# Patient Record
Sex: Female | Born: 2000 | Race: White | Hispanic: No | Marital: Single | Smoking: Never smoker
Health system: Southern US, Community
[De-identification: ages and names within clinical notes are randomized; demographics above are authoritative.]

## PROBLEM LIST (undated history)

## (undated) DIAGNOSIS — F418 Other specified anxiety disorders: Secondary | ICD-10-CM

## (undated) DIAGNOSIS — G43909 Migraine, unspecified, not intractable, without status migrainosus: Secondary | ICD-10-CM

## (undated) HISTORY — PX: WISDOM TOOTH EXTRACTION: SHX21

## (undated) HISTORY — DX: Migraine, unspecified, not intractable, without status migrainosus: G43.909

---
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
## 2019-10-16 ENCOUNTER — Other Ambulatory Visit: Payer: Self-pay

## 2019-10-16 ENCOUNTER — Ambulatory Visit: Payer: BC Managed Care – PPO | Admitting: Certified Nurse Midwife

## 2019-10-16 ENCOUNTER — Encounter: Payer: Self-pay | Admitting: Certified Nurse Midwife

## 2019-10-16 VITALS — BP 104/63 | HR 95 | Ht 63.0 in | Wt 175.0 lb

## 2019-10-16 DIAGNOSIS — N760 Acute vaginitis: Secondary | ICD-10-CM

## 2019-10-16 DIAGNOSIS — N921 Excessive and frequent menstruation with irregular cycle: Secondary | ICD-10-CM

## 2019-10-16 MED ORDER — MISOPROSTOL 200 MCG PO TABS: ORAL_TABLET | ORAL | 0 refills | Status: DC

## 2019-10-16 NOTE — Progress Notes (Signed)
Patient c/o thick yellow vaginal discharge x7 months.  Patient currently on weekly Diflucan for the past month with no relief.

## 2019-10-16 NOTE — Progress Notes (Signed)
GYN ENCOUNTER NOTE  Subjective:       Kelly Burnett is a 18 y.o. G0P0000 female is here for gynecologic evaluation of the following issues:  1. Recurrent yeast infection 2. Breakthrough bleeding on OCPs  Reports thick, yellow vaginal discharge with internal and external itching for the last seven (7) months. Diagnosed as yeast by PCP on three (3) separate occasions.   Completed first month of three (3) month weekly course of diflucan with limited relief.   Changed OCP recently due to continued breakthrough bleeding. Taking Lamictal. Questions IUD insertion.   Denies difficulty breathing or respiratory distress, chest pain, abdominal pain, excessive vaginal bleeding, dysuria, and leg pain or swelling.    Gynecologic History  Patient's last menstrual period was 09/27/2019 (exact date). Period Cycle (Days): 30 Period Duration (Days): 14 Period Pattern: Regular Menstrual Flow: Heavy, Light Menstrual Control: Tampon, Maxi pad, Thin pad Dysmenorrhea: (!) Severe Dysmenorrhea Symptoms: Cramping  Contraception: abstinence; never sexually active  Last Pap: N/A  Obstetric History  OB History  Gravida Para Term Preterm AB Living  0 0 0 0 0 0  SAB TAB Ectopic Multiple Live Births  0 0 0 0 0    Past Medical History:  Diagnosis Date  . Anxiety associated with depression     Past Surgical History:  Procedure Laterality Date  . WISDOM TOOTH EXTRACTION      Current Outpatient Medications on File Prior to Visit  Medication Sig Dispense Refill  . busPIRone (BUSPAR) 15 MG tablet Take 15 mg by mouth daily.     Marland Kitchen desvenlafaxine (PRISTIQ) 50 MG 24 hr tablet Take 50 mg by mouth daily.    . fluconazole (DIFLUCAN) 150 MG tablet Take 150 mg by mouth once a week.    Marland Kitchen FLUoxetine (PROZAC) 10 MG capsule Take 20 mg by mouth daily.     . hydrOXYzine (ATARAX/VISTARIL) 10 MG tablet Take 10 mg by mouth 2 (two) times daily as needed.    . lamoTRIgine (LAMICTAL) 25 MG tablet Take 25 mg by mouth  daily.     Marland Kitchen levonorgestrel-ethinyl estradiol (AVIANE,ALESSE,LESSINA) 0.1-20 MG-MCG tablet Take 1 tablet by mouth daily.     . mometasone (ELOCON) 0.1 % ointment Apply 1 application topically as needed.    Marland Kitchen omeprazole (PRILOSEC) 40 MG capsule Take 40 mg by mouth daily.    . traZODone (DESYREL) 50 MG tablet Take 1 tablet by mouth as needed.     No current facility-administered medications on file prior to visit.     No Known Allergies  Social History   Socioeconomic History  . Marital status: Single    Spouse name: Not on file  . Number of children: Not on file  . Years of education: Not on file  . Highest education level: Not on file  Occupational History  . Not on file  Social Needs  . Financial resource strain: Not on file  . Food insecurity    Worry: Not on file    Inability: Not on file  . Transportation needs    Medical: Not on file    Non-medical: Not on file  Tobacco Use  . Smoking status: Never Smoker  . Smokeless tobacco: Never Used  Substance and Sexual Activity  . Alcohol use: Never    Frequency: Never  . Drug use: Never  . Sexual activity: Never    Birth control/protection: Pill  Lifestyle  . Physical activity    Days per week: Not on file    Minutes  per session: Not on file  . Stress: Not on file  Relationships  . Social Herbalist on phone: Not on file    Gets together: Not on file    Attends religious service: Not on file    Active member of club or organization: Not on file    Attends meetings of clubs or organizations: Not on file    Relationship status: Not on file  . Intimate partner violence    Fear of current or ex partner: Not on file    Emotionally abused: Not on file    Physically abused: Not on file    Forced sexual activity: Not on file  Other Topics Concern  . Not on file  Social History Narrative  . Not on file    Family History  Problem Relation Age of Onset  . Healthy Mother   . Healthy Father   . Breast cancer  Neg Hx   . Ovarian cancer Neg Hx   . Colon cancer Neg Hx     The following portions of the patient's history were reviewed and updated as appropriate: allergies, current medications, past family history, past medical history, past social history, past surgical history and problem list.  Review of Systems  ROS negative except as noted above. Information obtained from patient.   Objective:   BP 104/63   Pulse 95   Ht 5\' 3"  (1.6 m)   Wt 175 lb (79.4 kg)   LMP 09/27/2019 (Exact Date)   BMI 31.00 kg/m    CONSTITUTIONAL: Well-developed, well-nourished female in no acute distress.   PELVIC:  External Genitalia: Normal except thick, white discharge present between labia  Vagina: Swab collected  MUSCULOSKELETAL: Normal range of motion. No tenderness.  No cyanosis, clubbing, or edema.  Assessment:   1. Recurrent vaginitis  - Candida 6 Species Profile, NAA  2. Breakthrough bleeding on OCPs  Plan:   Vaginal swab collected, see orders.   Discussed vaginal health techniques and home treatment measures. Sample of Hylafem given.   Education regarding IUDs and management of bleeding. Pamphlets given for [place].   Reviewed red flag symptoms and when to call.   Rx: Cytotec, see orders  RTC for IUD insertion as desired.    Diona Fanti, CNM [facility], [facility]

## 2019-10-16 NOTE — Patient Instructions (Addendum)
Boric Acid vaginal suppository What is this medicine? BORIC ACID (BOHR ik AS id) helps to promote the proper acid balance in the vagina. It is used to help treat yeast infections of the vagina and relieve symptoms such as itching and burning. This medicine may be used for other purposes; ask your health care provider or pharmacist if you have questions. COMMON BRAND NAME(S): Hylafem What should I tell my health care provider before I take this medicine? They need to know if you have any of these conditions:  diabetes  frequent infections  HIV or AIDS  immune system problems  an unusual or allergic reaction to boric acid, other medicines, foods, dyes, or preservatives  pregnant or trying to get pregnant  breast-feeding How should I use this medicine? This medicine is for use in the vagina. Do not take by mouth. Follow the directions on the prescription label. Read package directions carefully before using. Wash hands before and after use. Use this medicine at bedtime, unless otherwise directed by your doctor. Do not use your medicine more often than directed. Do not stop using this medicine except on your doctor's advice. Talk to your pediatrician regarding the use of this medicine in children. This medicine is not approved for use in children. Overdosage: If you think you have taken too much of this medicine contact a poison control center or emergency room at once. NOTE: This medicine is only for you. Do not share this medicine with others. What if I miss a dose? If you miss a dose, use it as soon as you can. If it is almost time for your next dose, use only that dose. Do not use double or extra doses. What may interact with this medicine? Interactions are not expected. Do not use any other vaginal products without telling your doctor or health care professional. This list may not describe all possible interactions. Give your health care provider a list of all the medicines, herbs,  non-prescription drugs, or dietary supplements you use. Also tell them if you smoke, drink alcohol, or use illegal drugs. Some items may interact with your medicine. What should I watch for while using this medicine? Tell your doctor or health care professional if your symptoms do not start to get better within a few days. It is better not to have sex until you have finished your treatment. This medicine may damage condoms or diaphragms and cause them not to work properly. It may also decrease the effect of vaginal spermicides. Do not rely on any of these methods to prevent sexually transmitted diseases or pregnancy while you are using this medicine. Vaginal medicines usually will come out of the vagina during treatment. To keep the medicine from getting on your clothing, wear a panty liner. The use of tampons is not recommended. To help clear up the infection, wear freshly washed cotton, not synthetic, underwear. What side effects may I notice from receiving this medicine? Side effects that you should report to your doctor or health care professional as soon as possible:  allergic reactions like skin rash, itching or hives  vaginal irritation, redness, or burning Side effects that usually do not require medical attention (report to your doctor or health care professional if they continue or are bothersome):  vaginal discharge This list may not describe all possible side effects. Call your doctor for medical advice about side effects. You may report side effects to FDA at 1-800-FDA-1088. Where should I keep my medicine? Keep out of the reach of  children. Store in a cool, dry place between 15 and 30 degrees C (59 and 86 degrees F). Keep away from sunlight. Throw away any unused medicine after the expiration date. NOTE: This sheet is a summary. It may not cover all possible information. If you have questions about this medicine, talk to your doctor, pharmacist, or health care provider.  2020  Elsevier/Gold Standard (2016-01-09 07:29:58) Vaginitis Vaginitis is a condition in which the vaginal tissue swells and becomes red (inflamed). This condition is most often caused by a change in the normal balance of bacteria and yeast that live in the vagina. This change causes an overgrowth of certain bacteria or yeast, which causes the inflammation. There are different types of vaginitis, but the most common types are:  Bacterial vaginosis.  Yeast infection (candidiasis).  Trichomoniasis vaginitis. This is a sexually transmitted disease (STD).  Viral vaginitis.  Atrophic vaginitis.  Allergic vaginitis. What are the causes? The cause of this condition depends on the type of vaginitis. It can be caused by:  Bacteria (bacterial vaginosis).  Yeast, which is a fungus (yeast infection).  A parasite (trichomoniasis vaginitis).  A virus (viral vaginitis).  Low hormone levels (atrophic vaginitis). Low hormone levels can occur during pregnancy, breastfeeding, or after menopause.  Irritants, such as bubble baths, scented tampons, and feminine sprays (allergic vaginitis). Other factors can change the normal balance of the yeast and bacteria that live in the vagina. These include:  Antibiotic medicines.  Poor hygiene.  Diaphragms, vaginal sponges, spermicides, birth control pills, and intrauterine devices (IUD).  Sex.  Infection.  Uncontrolled diabetes.  A weakened defense (immune) system. What increases the risk? This condition is more likely to develop in women who:  Smoke.  Use vaginal douches, scented tampons, or scented sanitary pads.  Wear tight-fitting pants.  Wear thong underwear.  Use oral birth control pills or an IUD.  Have sex without a condom.  Have multiple sex partners.  Have an STD.  Frequently use the spermicide nonoxynol-9.  Eat lots of foods high in sugar.  Have uncontrolled diabetes.  Have low estrogen levels.  Have a weakened immune  system from an immune disorder or medical treatment.  Are pregnant or breastfeeding. What are the signs or symptoms? Symptoms vary depending on the cause of the vaginitis. Common symptoms include:  Abnormal vaginal discharge. ? The discharge is white, gray, or yellow with bacterial vaginosis. ? The discharge is thick, white, and cheesy with a yeast infection. ? The discharge is frothy and yellow or greenish with trichomoniasis.  A bad vaginal smell. The smell is fishy with bacterial vaginosis.  Vaginal itching, pain, or swelling.  Sex that is painful.  Pain or burning when urinating. Sometimes there are no symptoms. How is this diagnosed? This condition is diagnosed based on your symptoms and medical history. A physical exam, including a pelvic exam, will also be done. You may also have other tests, including:  Tests to determine the pH level (acidity or alkalinity) of your vagina.  A whiff test, to assess the odor that results when a sample of your vaginal discharge is mixed with a potassium hydroxide solution.  Tests of vaginal fluid. A sample will be examined under a microscope. How is this treated? Treatment varies depending on the type of vaginitis you have. Your treatment may include:  Antibiotic creams or pills to treat bacterial vaginosis and trichomoniasis.  Antifungal medicines, such as vaginal creams or suppositories, to treat a yeast infection.  Medicine to ease discomfort if  you have viral vaginitis. Your sexual partner should also be treated.  Estrogen delivered in a cream, pill, suppository, or vaginal ring to treat atrophic vaginitis. If vaginal dryness occurs, lubricants and moisturizing creams may help. You may need to avoid scented soaps, sprays, or douches.  Stopping use of a product that is causing allergic vaginitis. Then using a vaginal cream to treat the symptoms. Follow these instructions at home: Lifestyle  Keep your genital area clean and dry.  Avoid soap, and only rinse the area with water.  Do not douche or use tampons until your health care provider says it is okay to do so. Use sanitary pads, if needed.  Do not have sex until your health care provider approves. When you can return to sex, practice safe sex and use condoms.  Wipe from front to back. This avoids the spread of bacteria from the rectum to the vagina. General instructions  Take over-the-counter and prescription medicines only as told by your health care provider.  If you were prescribed an antibiotic medicine, take or use it as told by your health care provider. Do not stop taking or using the antibiotic even if you start to feel better.  Keep all follow-up visits as told by your health care provider. This is important. How is this prevented?  Use mild, non-scented products. Do not use things that can irritate the vagina, such as fabric softeners. Avoid the following products if they are scented: ? Feminine sprays. ? Detergents. ? Tampons. ? Feminine hygiene products. ? Soaps or bubble baths.  Let air reach your genital area. ? Wear cotton underwear to reduce moisture buildup. ? Avoid wearing underwear while you sleep. ? Avoid wearing tight pants and underwear or nylons without a cotton panel. ? Avoid wearing thong underwear.  Take off any wet clothing, such as bathing suits, as soon as possible.  Practice safe sex and use condoms. Contact a health care provider if:  You have abdominal pain.  You have a fever.  You have symptoms that last for more than 2-3 days. Get help right away if:  You have a fever and your symptoms suddenly get worse. Summary  Vaginitis is a condition in which the vaginal tissue becomes inflamed.This condition is most often caused by a change in the normal balance of bacteria and yeast that live in the vagina.  Treatment varies depending on the type of vaginitis you have.  Do not douche, use tampons , or have sex until  your health care provider approves. When you can return to sex, practice safe sex and use condoms. This information is not intended to replace advice given to you by your health care provider. Make sure you discuss any questions you have with your health care provider. Document Released: 10/04/2007 Document Revised: 11/19/2017 Document Reviewed: 01/12/2017 Elsevier Patient Education  2020 [employer].  Levonorgestrel intrauterine device (IUD) What is this medicine? LEVONORGESTREL IUD (LEE voe nor jes trel) is a contraceptive (birth control) device. The device is placed inside the uterus by a healthcare professional. It is used to prevent pregnancy. This device can also be used to treat heavy bleeding that occurs during your period. This medicine may be used for other purposes; ask your health care provider or pharmacist if you have questions. COMMON BRAND NAME(S): Cameron Ali What should I tell my health care provider before I take this medicine? They need to know if you have any of these conditions:  abnormal Pap smear  cancer of  the breast, uterus, or cervix  diabetes  endometritis  genital or pelvic infection now or in the past  have more than one sexual partner or your partner has more than one partner  heart disease  history of an ectopic or tubal pregnancy  immune system problems  IUD in place  liver disease or tumor  problems with blood clots or take blood-thinners  seizures  use intravenous drugs  uterus of unusual shape  vaginal bleeding that has not been explained  an unusual or allergic reaction to levonorgestrel, other hormones, silicone, or polyethylene, medicines, foods, dyes, or preservatives  pregnant or trying to get pregnant  breast-feeding How should I use this medicine? This device is placed inside the uterus by a health care professional. Talk to your pediatrician regarding the use of this medicine in children. Special  care may be needed. Overdosage: If you think you have taken too much of this medicine contact a poison control center or emergency room at once. NOTE: This medicine is only for you. Do not share this medicine with others. What if I miss a dose? This does not apply. Depending on the brand of device you have inserted, the device will need to be replaced every 3 to 6 years if you wish to continue using this type of birth control. What may interact with this medicine? Do not take this medicine with any of the following medications:  amprenavir  bosentan  fosamprenavir This medicine may also interact with the following medications:  aprepitant  armodafinil  barbiturate medicines for inducing sleep or treating seizures  bexarotene  boceprevir  griseofulvin  medicines to treat seizures like carbamazepine, ethotoin, felbamate, oxcarbazepine, phenytoin, topiramate  modafinil  pioglitazone  rifabutin  rifampin  rifapentine  some medicines to treat HIV infection like atazanavir, efavirenz, indinavir, lopinavir, nelfinavir, tipranavir, ritonavir  St. John's wort  warfarin This list may not describe all possible interactions. Give your health care provider a list of all the medicines, herbs, non-prescription drugs, or dietary supplements you use. Also tell them if you smoke, drink alcohol, or use illegal drugs. Some items may interact with your medicine. What should I watch for while using this medicine? Visit your doctor or health care professional for regular check ups. See your doctor if you or your partner has sexual contact with others, becomes HIV positive, or gets a sexual transmitted disease. This product does not protect you against HIV infection (AIDS) or other sexually transmitted diseases. You can check the placement of the IUD yourself by reaching up to the top of your vagina with clean fingers to feel the threads. Do not pull on the threads. It is a good habit to  check placement after each menstrual period. Call your doctor right away if you feel more of the IUD than just the threads or if you cannot feel the threads at all. The IUD may come out by itself. You may become pregnant if the device comes out. If you notice that the IUD has come out use a backup birth control method like condoms and call your health care provider. Using tampons will not change the position of the IUD and are okay to use during your period. This IUD can be safely scanned with magnetic resonance imaging (MRI) only under specific conditions. Before you have an MRI, tell your healthcare provider that you have an IUD in place, and which type of IUD you have in place. What side effects may I notice from receiving this medicine? Side  effects that you should report to your doctor or health care professional as soon as possible:  allergic reactions like skin rash, itching or hives, swelling of the face, lips, or tongue  fever, flu-like symptoms  genital sores  high blood pressure  no menstrual period for 6 weeks during use  pain, swelling, warmth in the leg  pelvic pain or tenderness  severe or sudden headache  signs of pregnancy  stomach cramping  sudden shortness of breath  trouble with balance, talking, or walking  unusual vaginal bleeding, discharge  yellowing of the eyes or skin Side effects that usually do not require medical attention (report to your doctor or health care professional if they continue or are bothersome):  acne  breast pain  change in sex drive or performance  changes in weight  cramping, dizziness, or faintness while the device is being inserted  headache  irregular menstrual bleeding within first 3 to 6 months of use  nausea This list may not describe all possible side effects. Call your doctor for medical advice about side effects. You may report side effects to FDA at 1-800-FDA-1088. Where should I keep my medicine? This does  not apply. NOTE: This sheet is a summary. It may not cover all possible information. If you have questions about this medicine, talk to your doctor, pharmacist, or health care provider.  2020 Elsevier/Gold Standard (2018-10-18 13:22:01)   Intrauterine Device Insertion An intrauterine device (IUD) is a medical device that gets inserted into the uterus to prevent pregnancy. It is a small, T-shaped device that has one or two nylon strings hanging down from it. The strings hang out of the lower part of the uterus (cervix) to allow for future IUD removal. There are two types of IUDs available:  Copper IUD. This type of IUD has copper wire wrapped around it. Copper makes the uterus and fallopian tubes produce a fluid that kills sperm. A copper IUD may last up to 10 years.  Hormone IUD. This type of IUD is made of plastic and contains the hormone progestin (synthetic progesterone). The hormone thickens mucus in the cervix and prevents sperm from entering the uterus. It also thins the uterine lining to prevent implantation of a fertilized egg. The hormone can weaken or kill the sperm that get into the uterus. A hormone IUD may last 3-5 years. Tell a health care provider about:  Any allergies you have.  All medicines you are taking, including vitamins, herbs, eye drops, creams, and over-the-counter medicines.  Any problems you or family members have had with anesthetic medicines.  Any blood disorders you have.  Any surgeries you have had.  Any medical conditions you have, including any STIs (sexually transmitted infections) you may have.  Whether you are pregnant or may be pregnant. What are the risks? Generally, this is a safe procedure. However, problems may occur, including:  Infection.  Bleeding.  Allergic reactions to medicines.  Accidental puncture (perforation) of the uterus, or damage to other structures or organs.  Accidental placement of the IUD either in the muscle layer of  the uterus (myometrium) or outside the uterus.  The IUD falling out of the uterus (expulsion). This is more common among women who have recently had a child.  Pregnancy that happens in the fallopian tube (ectopic pregnancy).  Infection of the uterus and fallopian tubes (pelvic inflammatory disease). What happens before the procedure?  Schedule the IUD insertion for when you will have your menstrual period or right after, to make  sure you are not pregnant. Placement of the IUD is better tolerated shortly after a menstrual cycle.  Follow instructions from your health care provider about eating or drinking restrictions.  Ask your health care provider about changing or stopping your regular medicines. This is especially important if you are taking diabetes medicines or blood thinners.  You may get a pain reliever to take before the procedure.  You may have tests for: ? Pregnancy. A pregnancy test involves having a urine sample taken. ? STIs. Placing an IUD in someone who has an STI can make the infection worse. ? Cervical cancer. You may have a Pap test to check for this type of cancer. This means collecting cells from your cervix to be examined under a microscope.  You may have a physical exam to determine the size and position of your uterus. The procedure may vary among health care providers and hospitals. What happens during the procedure?  A tool (speculum) will be placed in your vagina and widened so that your health care provider can see your cervix.  Medicine may be applied to your cervix to help lower your risk of infection (antiseptic medicine).  You may be given an anesthetic medicine to numb each side of your cervix (intracervical block or paracervical block). This medicine is usually given by an injection into the cervix.  A tool (uterine sound) will be inserted into your uterus to determine the length of your uterus and the direction that your uterus may be tilted.  A slim  instrument or tube (IUD inserter) that holds the IUD will be inserted into your vagina, through your cervical canal, and into your uterus.  The IUD will be placed in the uterus, and the IUD inserter will be removed.  The strings that are attached to the IUD will be trimmed so that they lie just below the cervix. The procedure may vary among health care providers and hospitals. What happens after the procedure?  You may have bleeding after the procedure. This is normal. It varies from light bleeding (spotting) for a few days to menstrual-like bleeding.  You may have cramping and pain.  You may feel dizzy or light-headed.  You may have lower back pain. Summary  An intrauterine device (IUD) is a small, T-shaped device that has one or two nylon strings hanging down from it.  Two types of IUDs are available. You may have a copper IUD or a hormone IUD.  Schedule the IUD insertion for when you will have your menstrual period or right after, to make sure you are not pregnant. Placement of the IUD is better tolerated shortly after a menstrual cycle.  You may have bleeding after the procedure. This is normal. It varies from light spotting for a few days to menstrual-like bleeding. This information is not intended to replace advice given to you by your health care provider. Make sure you discuss any questions you have with your health care provider. Document Released: 08/05/2011 Document Revised: 11/19/2017 Document Reviewed: 10/28/2016 Elsevier Patient Education  2020 [employer].

## 2019-10-18 LAB — CANDIDA 6 SPECIES PROFILE, NAA
C PARAPSILOSIS/TROPICALIS: NEGATIVE
Candida albicans, NAA: NEGATIVE
Candida glabrata, NAA: NEGATIVE
Candida krusei, NAA: NEGATIVE
Candida lusitaniae, NAA: NEGATIVE

## 2019-10-19 ENCOUNTER — Encounter: Payer: Self-pay | Admitting: Certified Nurse Midwife

## 2019-10-20 ENCOUNTER — Other Ambulatory Visit: Payer: Self-pay

## 2019-10-20 MED ORDER — METRONIDAZOLE 0.75 % VA GEL: 1.0000 | Freq: Every day | VAGINAL | 0 refills | Status: AC

## 2019-10-27 ENCOUNTER — Encounter: Payer: Self-pay | Admitting: Certified Nurse Midwife

## 2019-10-27 ENCOUNTER — Ambulatory Visit (INDEPENDENT_AMBULATORY_CARE_PROVIDER_SITE_OTHER): Payer: BC Managed Care – PPO | Admitting: Certified Nurse Midwife

## 2019-10-27 ENCOUNTER — Other Ambulatory Visit: Payer: Self-pay

## 2019-10-27 VITALS — BP 99/67 | HR 77 | Ht 63.0 in | Wt 174.8 lb

## 2019-10-27 DIAGNOSIS — Z30430 Encounter for insertion of intrauterine contraceptive device: Secondary | ICD-10-CM

## 2019-10-27 NOTE — Patient Instructions (Signed)
IUD PLACEMENT POST-PROCEDURE INSTRUCTIONS  1. You may take Ibuprofen, Aleve or Tylenol for pain if needed.  Cramping should resolve within in 24 hours.  2. You may have a small amount of spotting.  You should wear a mini pad for the next few days.  3. You may have intercourse after 24 hours.  If you using this for birth control, it is effective immediately.  4. You need to call if you have any pelvic pain, fever, heavy bleeding or foul smelling vaginal discharge.  Irregular bleeding is common the first several months after having an IUD placed. You do not need to call for this reason unless you are concerned.  5. Shower or bathe as normal  You should have a follow-up appointment in 4-8 weeks for a re-check to make sure you are not having any problems.  Levonorgestrel intrauterine device (IUD) What is this medicine? LEVONORGESTREL IUD (LEE voe nor jes trel) is a contraceptive (birth control) device. The device is placed inside the uterus by a healthcare professional. It is used to prevent pregnancy. This device can also be used to treat heavy bleeding that occurs during your period. This medicine may be used for other purposes; ask your health care provider or pharmacist if you have questions. COMMON BRAND NAME(S): Kyleena, LILETTA, Mirena, Skyla What should I tell my health care provider before I take this medicine? They need to know if you have any of these conditions:  abnormal Pap smear  cancer of the breast, uterus, or cervix  diabetes  endometritis  genital or pelvic infection now or in the past  have more than one sexual partner or your partner has more than one partner  heart disease  history of an ectopic or tubal pregnancy  immune system problems  IUD in place  liver disease or tumor  problems with blood clots or take blood-thinners  seizures  use intravenous drugs  uterus of unusual shape  vaginal bleeding that has not been explained  an unusual or  allergic reaction to levonorgestrel, other hormones, silicone, or polyethylene, medicines, foods, dyes, or preservatives  pregnant or trying to get pregnant  breast-feeding How should I use this medicine? This device is placed inside the uterus by a health care professional. Talk to your pediatrician regarding the use of this medicine in children. Special care may be needed. Overdosage: If you think you have taken too much of this medicine contact a poison control center or emergency room at once. NOTE: This medicine is only for you. Do not share this medicine with others. What if I miss a dose? This does not apply. Depending on the brand of device you have inserted, the device will need to be replaced every 3 to 6 years if you wish to continue using this type of birth control. What may interact with this medicine? Do not take this medicine with any of the following medications:  amprenavir  bosentan  fosamprenavir This medicine may also interact with the following medications:  aprepitant  armodafinil  barbiturate medicines for inducing sleep or treating seizures  bexarotene  boceprevir  griseofulvin  medicines to treat seizures like carbamazepine, ethotoin, felbamate, oxcarbazepine, phenytoin, topiramate  modafinil  pioglitazone  rifabutin  rifampin  rifapentine  some medicines to treat HIV infection like atazanavir, efavirenz, indinavir, lopinavir, nelfinavir, tipranavir, ritonavir  St. John's wort  warfarin This list may not describe all possible interactions. Give your health care provider a list of all the medicines, herbs, non-prescription drugs, or dietary supplements   you use. Also tell them if you smoke, drink alcohol, or use illegal drugs. Some items may interact with your medicine. What should I watch for while using this medicine? Visit your doctor or health care professional for regular check ups. See your doctor if you or your partner has sexual  contact with others, becomes HIV positive, or gets a sexual transmitted disease. This product does not protect you against HIV infection (AIDS) or other sexually transmitted diseases. You can check the placement of the IUD yourself by reaching up to the top of your vagina with clean fingers to feel the threads. Do not pull on the threads. It is a good habit to check placement after each menstrual period. Call your doctor right away if you feel more of the IUD than just the threads or if you cannot feel the threads at all. The IUD may come out by itself. You may become pregnant if the device comes out. If you notice that the IUD has come out use a backup birth control method like condoms and call your health care provider. Using tampons will not change the position of the IUD and are okay to use during your period. This IUD can be safely scanned with magnetic resonance imaging (MRI) only under specific conditions. Before you have an MRI, tell your healthcare provider that you have an IUD in place, and which type of IUD you have in place. What side effects may I notice from receiving this medicine? Side effects that you should report to your doctor or health care professional as soon as possible:  allergic reactions like skin rash, itching or hives, swelling of the face, lips, or tongue  fever, flu-like symptoms  genital sores  high blood pressure  no menstrual period for 6 weeks during use  pain, swelling, warmth in the leg  pelvic pain or tenderness  severe or sudden headache  signs of pregnancy  stomach cramping  sudden shortness of breath  trouble with balance, talking, or walking  unusual vaginal bleeding, discharge  yellowing of the eyes or skin Side effects that usually do not require medical attention (report to your doctor or health care professional if they continue or are bothersome):  acne  breast pain  change in sex drive or performance  changes in  weight  cramping, dizziness, or faintness while the device is being inserted  headache  irregular menstrual bleeding within first 3 to 6 months of use  nausea This list may not describe all possible side effects. Call your doctor for medical advice about side effects. You may report side effects to FDA at 1-800-FDA-1088. Where should I keep my medicine? This does not apply. NOTE: This sheet is a summary. It may not cover all possible information. If you have questions about this medicine, talk to your doctor, pharmacist, or health care provider.  2020 Elsevier/Gold Standard (2018-10-18 13:22:01)  

## 2019-10-27 NOTE — Progress Notes (Signed)
Kelly Burnett is a 18 y.o. year old G80P0000 Caucasian female who presents for placement of a Mirena IUD.  BP 99/67   Pulse 77   Ht 5\' 3"  (1.6 m)   Wt 174 lb 12.8 oz (79.3 kg)   LMP 10/25/2019 (Exact Date)   BMI 30.96 kg/m   Patient has never been sexually active.   The risks and benefits of the method and placement have been thouroughly reviewed with the patient and all questions were answered.  Specifically the patient is aware of failure rate of 12/998, expulsion of the IUD and of possible perforation.  The patient is aware of irregular bleeding due to the method and understands the incidence of irregular bleeding diminishes with time.  Signed copy of informed consent in chart.   Time out was performed.  A small plastic speculum was placed in the vagina.  The cervix was visualized, prepped using Betadine, and grasped with a single tooth tenaculum. The uterus was  sounded to 9 cm.  Mirena IUD placed per manufacturer's recommendations.   The strings were trimmed to 3 cm.  The patient was given post procedure instructions, including signs and symptoms of infection and to check for the strings after each menses or each month, and refraining from intercourse or anything in the vagina for 3 days.  She was given a Mirena care card with date Mirena placed, and date Mirena to be removed.  Reviewed red flag symptoms and when to call.   RTC x 6-8 weeks for IUD string check or sooner if needed.    Diona Fanti, CNM [facility], [facility] 10/27/19 9:19 AM   NDC: 71696-789-38 Lot: [id number] Exp: 11/2021

## 2019-10-27 NOTE — Progress Notes (Signed)
Patient here for IUD insertion, no complaints.

## 2019-11-02 ENCOUNTER — Encounter: Payer: Self-pay | Admitting: Certified Nurse Midwife

## 2019-12-07 ENCOUNTER — Other Ambulatory Visit: Payer: Self-pay

## 2019-12-07 ENCOUNTER — Encounter: Payer: Self-pay | Admitting: Obstetrics and Gynecology

## 2019-12-07 ENCOUNTER — Ambulatory Visit: Payer: BC Managed Care – PPO | Admitting: Obstetrics and Gynecology

## 2019-12-07 VITALS — BP 115/70 | HR 83 | Ht 63.0 in | Wt 182.8 lb

## 2019-12-07 DIAGNOSIS — Z975 Presence of (intrauterine) contraceptive device: Secondary | ICD-10-CM

## 2019-12-07 NOTE — Progress Notes (Signed)
    GYNECOLOGY PROGRESS NOTE  Subjective:    Patient ID: Kelly Burnett, female    DOB: March 10, 2001, 18 y.o.   MRN: [id number]  HPI  Patient is a 18 y.o. G0P0000 female who presents for complaints of left sided sharp pain x 2 days, intermittent that occurred several weeks ago. She is a patient of Dani Gobble, CNM (out on maternity leave). Notes that she has not had any further episodes of pain since that time. Also reports that she attempted to feel for her Mirena IUD threads, however was unable to locate them.  Had her IUD placed last month.  Patient notes that her first cycle was better, only lasting ~ 6-7 days (usually lasts ~ 14 days).   The following portions of the patient's history were reviewed and updated as appropriate: allergies, current medications, past family history, past medical history, past social history, past surgical history and problem list.  Review of Systems Pertinent items noted in HPI and remainder of comprehensive ROS otherwise negative.   Objective:   Blood pressure 115/70, pulse 83, height 5\' 3"  (1.6 m), weight 182 lb 12.8 oz (82.9 kg). General appearance: alert and no distress Abdomen: soft, non-tender; bowel sounds normal; no masses,  no organomegaly Pelvic: external genitalia normal, rectovaginal septum normal.  Vagina without discharge.  Cervix normal appearing, no lesions and no motion tenderness. IUD threads not visible. Short threads noted at cervical os after attempts of retrieval with cytobrush. Uterus mobile, nontender, normal shape and size.  Adnexae non-palpable, nontender bilaterally.    Assessment:   IUD in place  Plan:   - Given reassurance that IUD is in place. Discussed that she may feel occasional minor aches/pains as her body gets used to the IUD.  Has noted an improvement in her cycles. Pain has resolved without interventions, was short-lived.  - Can f/u in 1 year for routine exam with Dani Gobble, CNM. Can f/u sooner if any other  problems or concerns.

## 2019-12-07 NOTE — Progress Notes (Signed)
Pt present for sharp pain. Pt has IUD and noticed sharp stabbing pain in her left side for 2 days. Pt stated that the pain was off and on.

## 2019-12-26 ENCOUNTER — Encounter: Payer: BC Managed Care – PPO | Admitting: Obstetrics and Gynecology

## 2020-07-15 ENCOUNTER — Ambulatory Visit: Payer: BC Managed Care – PPO | Admitting: Certified Nurse Midwife

## 2020-07-15 ENCOUNTER — Other Ambulatory Visit (HOSPITAL_COMMUNITY)
Admission: RE | Admit: 2020-07-15 | Discharge: 2020-07-15 | Disposition: A | Discharge status: Home / Self Care | Payer: BC Managed Care – PPO | Source: Ambulatory Visit | Attending: Certified Nurse Midwife | Admitting: Certified Nurse Midwife

## 2020-07-15 ENCOUNTER — Other Ambulatory Visit: Payer: Self-pay

## 2020-07-15 ENCOUNTER — Encounter: Payer: Self-pay | Admitting: Certified Nurse Midwife

## 2020-07-15 VITALS — BP 115/79 | HR 93 | Ht 63.0 in | Wt 203.7 lb

## 2020-07-15 DIAGNOSIS — Z975 Presence of (intrauterine) contraceptive device: Secondary | ICD-10-CM | POA: Insufficient documentation

## 2020-07-15 DIAGNOSIS — N921 Excessive and frequent menstruation with irregular cycle: Secondary | ICD-10-CM | POA: Insufficient documentation

## 2020-07-15 NOTE — Patient Instructions (Signed)
Levonorgestrel intrauterine device (IUD) What is this medicine? LEVONORGESTREL IUD (LEE voe nor jes trel) is a contraceptive (birth control) device. The device is placed inside the uterus by a healthcare professional. It is used to prevent pregnancy. This device can also be used to treat heavy bleeding that occurs during your period. This medicine may be used for other purposes; ask your health care provider or pharmacist if you have questions. COMMON BRAND NAME(S): Kyleena, LILETTA, Mirena, Skyla What should I tell my health care provider before I take this medicine? They need to know if you have any of these conditions:  abnormal Pap smear  cancer of the breast, uterus, or cervix  diabetes  endometritis  genital or pelvic infection now or in the past  have more than one sexual partner or your partner has more than one partner  heart disease  history of an ectopic or tubal pregnancy  immune system problems  IUD in place  liver disease or tumor  problems with blood clots or take blood-thinners  seizures  use intravenous drugs  uterus of unusual shape  vaginal bleeding that has not been explained  an unusual or allergic reaction to levonorgestrel, other hormones, silicone, or polyethylene, medicines, foods, dyes, or preservatives  pregnant or trying to get pregnant  breast-feeding How should I use this medicine? This device is placed inside the uterus by a health care professional. Talk to your pediatrician regarding the use of this medicine in children. Special care may be needed. Overdosage: If you think you have taken too much of this medicine contact a poison control center or emergency room at once. NOTE: This medicine is only for you. Do not share this medicine with others. What if I miss a dose? This does not apply. Depending on the brand of device you have inserted, the device will need to be replaced every 3 to 6 years if you wish to continue using this type  of birth control. What may interact with this medicine? Do not take this medicine with any of the following medications:  amprenavir  bosentan  fosamprenavir This medicine may also interact with the following medications:  aprepitant  armodafinil  barbiturate medicines for inducing sleep or treating seizures  bexarotene  boceprevir  griseofulvin  medicines to treat seizures like carbamazepine, ethotoin, felbamate, oxcarbazepine, phenytoin, topiramate  modafinil  pioglitazone  rifabutin  rifampin  rifapentine  some medicines to treat HIV infection like atazanavir, efavirenz, indinavir, lopinavir, nelfinavir, tipranavir, ritonavir  St. John's wort  warfarin This list may not describe all possible interactions. Give your health care provider a list of all the medicines, herbs, non-prescription drugs, or dietary supplements you use. Also tell them if you smoke, drink alcohol, or use illegal drugs. Some items may interact with your medicine. What should I watch for while using this medicine? Visit your doctor or health care professional for regular check ups. See your doctor if you or your partner has sexual contact with others, becomes HIV positive, or gets a sexual transmitted disease. This product does not protect you against HIV infection (AIDS) or other sexually transmitted diseases. You can check the placement of the IUD yourself by reaching up to the top of your vagina with clean fingers to feel the threads. Do not pull on the threads. It is a good habit to check placement after each menstrual period. Call your doctor right away if you feel more of the IUD than just the threads or if you cannot feel the threads at   all. The IUD may come out by itself. You may become pregnant if the device comes out. If you notice that the IUD has come out use a backup birth control method like condoms and call your health care provider. Using tampons will not change the position of the  IUD and are okay to use during your period. This IUD can be safely scanned with magnetic resonance imaging (MRI) only under specific conditions. Before you have an MRI, tell your healthcare provider that you have an IUD in place, and which type of IUD you have in place. What side effects may I notice from receiving this medicine? Side effects that you should report to your doctor or health care professional as soon as possible:  allergic reactions like skin rash, itching or hives, swelling of the face, lips, or tongue  fever, flu-like symptoms  genital sores  high blood pressure  no menstrual period for 6 weeks during use  pain, swelling, warmth in the leg  pelvic pain or tenderness  severe or sudden headache  signs of pregnancy  stomach cramping  sudden shortness of breath  trouble with balance, talking, or walking  unusual vaginal bleeding, discharge  yellowing of the eyes or skin Side effects that usually do not require medical attention (report to your doctor or health care professional if they continue or are bothersome):  acne  breast pain  change in sex drive or performance  changes in weight  cramping, dizziness, or faintness while the device is being inserted  headache  irregular menstrual bleeding within first 3 to 6 months of use  nausea This list may not describe all possible side effects. Call your doctor for medical advice about side effects. You may report side effects to FDA at 1-800-FDA-1088. Where should I keep my medicine? This does not apply. NOTE: This sheet is a summary. It may not cover all possible information. If you have questions about this medicine, talk to your doctor, pharmacist, or health care provider.  2020 Elsevier/Gold Standard (2018-10-18 13:22:01)  

## 2020-07-15 NOTE — Progress Notes (Signed)
GYN ENCOUNTER NOTE  Subjective:       Kelly Burnett is a 19 y.o. G0P0000 female is here for gynecologic evaluation of the following issues:  1. Breakthrough bleeding with IUD; reports menses every other week for the last three (3) to four (4) months  Denies difficulty breathing or respiratory distress, chest pain, abdominal pain, dysuria, and leg pain or swelling.    Gynecologic History  Patient's last menstrual period was 07/05/2020.  Contraception: abstinence and IUD   Last Pap: N/A  Obstetric History  OB History  Gravida Para Term Preterm AB Living  0 0 0 0 0 0  SAB TAB Ectopic Multiple Live Births  0 0 0 0 0    Past Medical History:  Diagnosis Date  . Anxiety associated with depression     Past Surgical History:  Procedure Laterality Date  . WISDOM TOOTH EXTRACTION      Current Outpatient Medications on File Prior to Visit  Medication Sig Dispense Refill  . desvenlafaxine (PRISTIQ) 50 MG 24 hr tablet Take 50 mg by mouth daily. As needed    . hydrOXYzine (ATARAX/VISTARIL) 10 MG tablet Take 10 mg by mouth 2 (two) times daily as needed.    Marland Kitchen levonorgestrel (MIRENA) 20 MCG/24HR IUD 1 each by Intrauterine route once.    Marland Kitchen QUEtiapine (SEROQUEL) 50 MG tablet Take 50 mg by mouth at bedtime.    . traZODone (DESYREL) 50 MG tablet Take 1 tablet by mouth as needed.    . busPIRone (BUSPAR) 15 MG tablet Take 15 mg by mouth daily.  (Patient not taking: Reported on 07/15/2020)    . FLUoxetine (PROZAC) 10 MG capsule Take 20 mg by mouth daily.  (Patient not taking: Reported on 07/15/2020)    . lamoTRIgine (LAMICTAL) 25 MG tablet Take 25 mg by mouth daily.  (Patient not taking: Reported on 07/15/2020)    . omeprazole (PRILOSEC) 40 MG capsule Take 40 mg by mouth daily. (Patient not taking: Reported on 07/15/2020)     No current facility-administered medications on file prior to visit.    No Known Allergies  Social History   Socioeconomic History  . Marital status: Single     Spouse name: Not on file  . Number of children: Not on file  . Years of education: Not on file  . Highest education level: Not on file  Occupational History  . Not on file  Tobacco Use  . Smoking status: Never Smoker  . Smokeless tobacco: Never Used  Vaping Use  . Vaping Use: Never used  Substance and Sexual Activity  . Alcohol use: Never  . Drug use: Never  . Sexual activity: Never    Birth control/protection: I.U.D.  Other Topics Concern  . Not on file  Social History Narrative  . Not on file   Social Determinants of Health   Financial Resource Strain:   . Difficulty of Paying Living Expenses:   Food Insecurity:   . Worried About Programme researcher, broadcasting/film/video in the Last Year:   . Barista in the Last Year:   Transportation Needs:   . Freight forwarder (Medical):   Marland Kitchen Lack of Transportation (Non-Medical):   Physical Activity:   . Days of Exercise per Week:   . Minutes of Exercise per Session:   Stress:   . Feeling of Stress :   Social Connections:   . Frequency of Communication with Friends and Family:   . Frequency of Social Gatherings with Friends and  Family:   . Attends Religious Services:   . Active Member of Clubs or Organizations:   . Attends Banker Meetings:   Marland Kitchen Marital Status:   Intimate Partner Violence:   . Fear of Current or Ex-Partner:   . Emotionally Abused:   Marland Kitchen Physically Abused:   . Sexually Abused:     Family History  Problem Relation Age of Onset  . Healthy Mother   . Healthy Father   . Breast cancer Neg Hx   . Ovarian cancer Neg Hx   . Colon cancer Neg Hx     The following portions of the patient's history were reviewed and updated as appropriate: allergies, current medications, past family history, past medical history, past social history, past surgical history and problem list.  Review of Systems  ROS negative except as noted above. Information obtained from patient.   Objective:   BP 115/79   Pulse 93   Ht 5'  3" (1.6 m)   Wt (!) 203 lb 11.2 oz (92.4 kg)   LMP 07/05/2020   BMI 36.08 kg/m    CONSTITUTIONAL: Well-developed, well-nourished female in no acute distress.   PELVIC:  External Genitalia: Normal  Vagina: Normal  Cervix: Normal, IUD strings NOT visible, swab collected  MUSCULOSKELETAL: Normal range of motion. No tenderness.  No cyanosis, clubbing, or edema.  Assessment:   1. Breakthrough bleeding associated with intrauterine device (IUD)  - Cervicovaginal ancillary only - US PELVIS TRANSVAGINAL NON-OB (TV ONLY); Future - US PELVIS (TRANSABDOMINAL ONLY); Future     Plan:   Vaginal swab collected, see orders.   Reviewed red flag symptoms and when to call.   RTC for ultrasound to verify IUD placement on Thursday.   Serafina Royals, CNM [facility], [facility] 07/15/20 5:29 PM

## 2020-07-15 NOTE — Progress Notes (Signed)
Pt present for irregular cycles. Pt stated that she is still having irregular cycles after getting the IUD.

## 2020-07-17 LAB — CERVICOVAGINAL ANCILLARY ONLY
Bacterial Vaginitis (gardnerella): NEGATIVE
Candida Glabrata: NEGATIVE
Candida Vaginitis: NEGATIVE
Comment: NEGATIVE
Comment: NEGATIVE
Comment: NEGATIVE

## 2020-07-18 ENCOUNTER — Encounter: Payer: BC Managed Care – PPO | Admitting: Certified Nurse Midwife

## 2020-07-18 ENCOUNTER — Other Ambulatory Visit: Payer: BC Managed Care – PPO

## 2020-07-25 ENCOUNTER — Encounter: Payer: Self-pay | Admitting: Certified Nurse Midwife

## 2020-07-25 ENCOUNTER — Telehealth: Payer: Self-pay | Admitting: Obstetrics and Gynecology

## 2020-07-25 ENCOUNTER — Ambulatory Visit (INDEPENDENT_AMBULATORY_CARE_PROVIDER_SITE_OTHER): Payer: BC Managed Care – PPO

## 2020-07-25 ENCOUNTER — Telehealth: Payer: Self-pay | Admitting: Certified Nurse Midwife

## 2020-07-25 ENCOUNTER — Ambulatory Visit: Payer: BC Managed Care – PPO | Admitting: Certified Nurse Midwife

## 2020-07-25 ENCOUNTER — Other Ambulatory Visit
Admission: RE | Admit: 2020-07-25 | Discharge: 2020-07-25 | Disposition: A | Discharge status: Home / Self Care | Payer: BC Managed Care – PPO | Source: Ambulatory Visit | Attending: Obstetrics and Gynecology | Admitting: Obstetrics and Gynecology

## 2020-07-25 ENCOUNTER — Other Ambulatory Visit: Payer: Self-pay

## 2020-07-25 VITALS — BP 112/52 | HR 99 | Ht 63.0 in | Wt 204.0 lb

## 2020-07-25 DIAGNOSIS — Z20822 Contact with and (suspected) exposure to covid-19: Secondary | ICD-10-CM | POA: Insufficient documentation

## 2020-07-25 DIAGNOSIS — Z975 Presence of (intrauterine) contraceptive device: Secondary | ICD-10-CM | POA: Diagnosis not present

## 2020-07-25 DIAGNOSIS — T8332XD Displacement of intrauterine contraceptive device, subsequent encounter: Secondary | ICD-10-CM

## 2020-07-25 DIAGNOSIS — N921 Excessive and frequent menstruation with irregular cycle: Secondary | ICD-10-CM

## 2020-07-25 DIAGNOSIS — Z01812 Encounter for preprocedural laboratory examination: Secondary | ICD-10-CM | POA: Diagnosis not present

## 2020-07-25 DIAGNOSIS — Z30432 Encounter for removal of intrauterine contraceptive device: Secondary | ICD-10-CM

## 2020-07-25 DIAGNOSIS — T8332XA Displacement of intrauterine contraceptive device, initial encounter: Secondary | ICD-10-CM | POA: Diagnosis present

## 2020-07-25 LAB — SARS CORONAVIRUS 2 (TAT 6-24 HRS): SARS Coronavirus 2: NEGATIVE

## 2020-07-25 NOTE — Telephone Encounter (Signed)
The pt called back in and stated that she talked to [facility] ([place]) and that she needs to ask the provider.  I told her that the provider is with pts I will ask. I asked FH and she said she she didn't know to ask CM. I asked cm and she gave me [phone], and if that doesn't work to call [phone]. The pt verablly understood.

## 2020-07-25 NOTE — Progress Notes (Signed)
Pt present for IUD removal. Would like to discuss B/C options.

## 2020-07-25 NOTE — H&P (Signed)
GYNECOLOGY PREOPERATIVE HISTORY AND PHYSICAL   Subjective:  Kelly Burnett is a 19 y.o. G0P0000 here for surgical management of lost IUD threads.   Indications for procedure include: lost IUD threads, abnormal bleeding with IUD, failed office attempts for removal. No significant preoperative concerns.  Proposed surgery: Hysteroscopic IUD removal   Pertinent Gynecological History: Menses: irregular occurring approximately every 10-14 days without intermenstrual spotting Bleeding: dysfunctional uterine bleeding Contraception: IUD    Past Medical History:  Diagnosis Date  . Anxiety associated with depression     Past Surgical History:  Procedure Laterality Date  . WISDOM TOOTH EXTRACTION      OB History  Gravida Para Term Preterm AB Living  0 0 0 0 0 0  SAB TAB Ectopic Multiple Live Births  0 0 0 0 0    Family History  Problem Relation Age of Onset  . Healthy Mother   . Healthy Father   . Breast cancer Neg Hx   . Ovarian cancer Neg Hx   . Colon cancer Neg Hx    Social History   Socioeconomic History  . Marital status: Single    Spouse name: Not on file  . Number of children: Not on file  . Years of education: Not on file  . Highest education level: Not on file  Occupational History  . Not on file  Tobacco Use  . Smoking status: Never Smoker  . Smokeless tobacco: Never Used  Vaping Use  . Vaping Use: Never used  Substance and Sexual Activity  . Alcohol use: Never  . Drug use: Never  . Sexual activity: Never    Birth control/protection: I.U.D.  Other Topics Concern  . Not on file  Social History Narrative  . Not on file   Social Determinants of Health   Financial Resource Strain:   . Difficulty of Paying Living Expenses:   Food Insecurity:   . Worried About Programme researcher, broadcasting/film/video in the Last Year:   . Barista in the Last Year:   Transportation Needs:   . Freight forwarder (Medical):   Marland Kitchen Lack of Transportation (Non-Medical):     Physical Activity:   . Days of Exercise per Week:   . Minutes of Exercise per Session:   Stress:   . Feeling of Stress :   Social Connections:   . Frequency of Communication with Friends and Family:   . Frequency of Social Gatherings with Friends and Family:   . Attends Religious Services:   . Active Member of Clubs or Organizations:   . Attends Banker Meetings:   Marland Kitchen Marital Status:   Intimate Partner Violence:   . Fear of Current or Ex-Partner:   . Emotionally Abused:   Marland Kitchen Physically Abused:   . Sexually Abused:    Current Outpatient Medications on File Prior to Visit  Medication Sig Dispense Refill  . desvenlafaxine (PRISTIQ) 50 MG 24 hr tablet Take 50 mg by mouth daily. As needed    . hydrOXYzine (ATARAX/VISTARIL) 10 MG tablet Take 10 mg by mouth 2 (two) times daily as needed.    . traZODone (DESYREL) 50 MG tablet Take 1 tablet by mouth as needed.    . busPIRone (BUSPAR) 15 MG tablet Take 15 mg by mouth daily.  (Patient not taking: Reported on 07/15/2020)    . FLUoxetine (PROZAC) 10 MG capsule Take 20 mg by mouth daily.  (Patient not taking: Reported on 07/15/2020)    . lamoTRIgine (  LAMICTAL) 25 MG tablet Take 25 mg by mouth daily.  (Patient not taking: Reported on 07/15/2020)    . levonorgestrel (MIRENA) 20 MCG/24HR IUD 1 each by Intrauterine route once. (Patient not taking: Reported on 07/25/2020)    . omeprazole (PRILOSEC) 40 MG capsule Take 40 mg by mouth daily. (Patient not taking: Reported on 07/15/2020)    . QUEtiapine (SEROQUEL) 50 MG tablet Take 50 mg by mouth at bedtime. (Patient not taking: Reported on 07/25/2020)     No current facility-administered medications on file prior to visit.   Allergies  Allergen Reactions  . Adhesive [Tape] Itching  . Nickel Rash      Review of Systems Constitutional: No recent fever/chills/sweats Respiratory: No recent cough/bronchitis Cardiovascular: No chest pain Gastrointestinal: No recent  nausea/vomiting/diarrhea Genitourinary: No UTI symptoms Hematologic/lymphatic:No history of coagulopathy or recent blood thinner use    Objective:   Blood pressure (!) 112/52, pulse 99, height 5\' 3"  (1.6 m), weight 204 lb (92.5 kg), last menstrual period 07/22/2020. CONSTITUTIONAL: Well-developed, well-nourished female in no acute distress.  HENT:  Normocephalic, atraumatic, External right and left ear normal. Oropharynx is clear and moist EYES: Conjunctivae and EOM are normal. Pupils are equal, round, and reactive to light. No scleral icterus.  NECK: Normal range of motion, supple, no masses SKIN: Skin is warm and dry. No rash noted. Not diaphoretic. No erythema. No pallor. NEUROLOGIC: Alert and oriented to person, place, and time. Normal reflexes, muscle tone coordination. No cranial nerve deficit noted. PSYCHIATRIC: Normal mood and affect. Normal behavior. Normal judgment and thought content. CARDIOVASCULAR: Normal heart rate noted, regular rhythm RESPIRATORY: Effort and breath sounds normal, no problems with respiration noted ABDOMEN: Soft, nontender, nondistended. PELVIC: External genitalia normal. Vagina with scant blood in vault. Cervix normal appearing, no lesions. IUD threads not visible.  MUSCULOSKELETAL: Normal range of motion. No edema and no tenderness. 2+ distal pulses.    Labs: No results found for: WBC, HGB, HCT, MCV, PLT   Imaging Studies: Patient Name: Kelly Burnett DOB: July 31, 2001 MRN: [id number] ULTRASOUND REPORT  Location: [facility]  Date of Service: 07/25/2020     Indications:AUB   Findings:  The uterus is anteverted and measures 6.5 x 2.8 x 3.9 cm.. Echo texture is homogenous without evidence of focal masses. ** The Endometrium measures 4 mm. IUD was seen in the Mid to lower potion of the endometrium.  Right Ovary measures 2.6 x 1.6 x 1.7 cm. It is normal in appearance. Left Ovary measures 2.4 x 1.6 x 1.7 cm. It is normal in  appearance. Survey of the adnexa demonstrates no adnexal masses. There is no free fluid in the cul de sac.  Impression: 1. IUD was seen as described above  Recommendations: 1.Clinical correlation with the patient's History and Physical Exam.   Jenine M. 09/24/2020    RDMS   Assessment:    Lost IUD threads Abnormal bleeding with IUD  Plan:   Counseling: Procedure, risks, reasons, benefits and complications (including injury to bowel, bladder, major blood vessel, bleeding, possibility of transfusion, infection, or fistula formation) reviewed in detail. Likelihood of success in alleviating the patient's condition was discussed. Routine postoperative instructions will be reviewed with the patient and her family in detail after surgery.  The patient concurred with the proposed plan, giving informed written consent for the surgery.  Preop testing ordered including COVID screening. Instructions reviewed, including NPO after midnight.   Marciano Sequin, MD [facility]

## 2020-07-25 NOTE — Telephone Encounter (Signed)
Please contact patient and make sure they collected swab. Yes, that is the typical response; however, most results are available sooner. Reassure her that if it is not back in time, then we can send a rapid test when she is admitted. Thanks, Serafina Royals, CNM

## 2020-07-25 NOTE — Telephone Encounter (Signed)
Pt called in and stated that she was seen today and that she was sent over to the hospital to do a covid test to remove her IUD. The pt stated that the nurse at the testing site asked her when she was having the surgery and she told her tomorrow but she then was told by the nurse that her results for the covid testing would be in either Monday or Tuesday. I tried to call back to [facility] to ask. I told the pt I will send a message and that someone will be in touch with her. Please advise

## 2020-07-25 NOTE — Telephone Encounter (Signed)
Pt called in and stated that the hospital called and that she doesn't know what time to be at the hospital and she wants to know what she can or cant eat and drink before. I told her I didn't have the number.  I asked her does she know what number called could she return there call.? She stated yes.    The pt called back in and stated that she talked to [facility] and that she needs to ask the provider.  I told her that the provider is with pts I will ask. I asked FH and she said she she didn't know to ask CM. I asked cm and she gave me [phone], and if that doesn't work to call [phone]. The pt verablly understood.

## 2020-07-25 NOTE — Telephone Encounter (Signed)
Called pt and informed her what JML stated in the last message , and the pt verbally understood.

## 2020-07-25 NOTE — H&P (View-Only) (Signed)
  GYNECOLOGY PREOPERATIVE HISTORY AND PHYSICAL   Subjective:  Kelly Burnett is a 19 y.o. G0P0000 here for surgical management of lost IUD threads.   Indications for procedure include: lost IUD threads, abnormal bleeding with IUD, failed office attempts for removal. No significant preoperative concerns.  Proposed surgery: Hysteroscopic IUD removal   Pertinent Gynecological History: Menses: irregular occurring approximately every 10-14 days without intermenstrual spotting Bleeding: dysfunctional uterine bleeding Contraception: IUD    Past Medical History:  Diagnosis Date  . Anxiety associated with depression     Past Surgical History:  Procedure Laterality Date  . WISDOM TOOTH EXTRACTION      OB History  Gravida Para Term Preterm AB Living  0 0 0 0 0 0  SAB TAB Ectopic Multiple Live Births  0 0 0 0 0    Family History  Problem Relation Age of Onset  . Healthy Mother   . Healthy Father   . Breast cancer Neg Hx   . Ovarian cancer Neg Hx   . Colon cancer Neg Hx    Social History   Socioeconomic History  . Marital status: Single    Spouse name: Not on file  . Number of children: Not on file  . Years of education: Not on file  . Highest education level: Not on file  Occupational History  . Not on file  Tobacco Use  . Smoking status: Never Smoker  . Smokeless tobacco: Never Used  Vaping Use  . Vaping Use: Never used  Substance and Sexual Activity  . Alcohol use: Never  . Drug use: Never  . Sexual activity: Never    Birth control/protection: I.U.D.  Other Topics Concern  . Not on file  Social History Narrative  . Not on file   Social Determinants of Health   Financial Resource Strain:   . Difficulty of Paying Living Expenses:   Food Insecurity:   . Worried About Running Out of Food in the Last Year:   . Ran Out of Food in the Last Year:   Transportation Needs:   . Lack of Transportation (Medical):   . Lack of Transportation (Non-Medical):     Physical Activity:   . Days of Exercise per Week:   . Minutes of Exercise per Session:   Stress:   . Feeling of Stress :   Social Connections:   . Frequency of Communication with Friends and Family:   . Frequency of Social Gatherings with Friends and Family:   . Attends Religious Services:   . Active Member of Clubs or Organizations:   . Attends Club or Organization Meetings:   . Marital Status:   Intimate Partner Violence:   . Fear of Current or Ex-Partner:   . Emotionally Abused:   . Physically Abused:   . Sexually Abused:    Current Outpatient Medications on File Prior to Visit  Medication Sig Dispense Refill  . desvenlafaxine (PRISTIQ) 50 MG 24 hr tablet Take 50 mg by mouth daily. As needed    . hydrOXYzine (ATARAX/VISTARIL) 10 MG tablet Take 10 mg by mouth 2 (two) times daily as needed.    . traZODone (DESYREL) 50 MG tablet Take 1 tablet by mouth as needed.    . busPIRone (BUSPAR) 15 MG tablet Take 15 mg by mouth daily.  (Patient not taking: Reported on 07/15/2020)    . FLUoxetine (PROZAC) 10 MG capsule Take 20 mg by mouth daily.  (Patient not taking: Reported on 07/15/2020)    . lamoTRIgine (  LAMICTAL) 25 MG tablet Take 25 mg by mouth daily.  (Patient not taking: Reported on 07/15/2020)    . levonorgestrel (MIRENA) 20 MCG/24HR IUD 1 each by Intrauterine route once. (Patient not taking: Reported on 07/25/2020)    . omeprazole (PRILOSEC) 40 MG capsule Take 40 mg by mouth daily. (Patient not taking: Reported on 07/15/2020)    . QUEtiapine (SEROQUEL) 50 MG tablet Take 50 mg by mouth at bedtime. (Patient not taking: Reported on 07/25/2020)     No current facility-administered medications on file prior to visit.   Allergies  Allergen Reactions  . Adhesive [Tape] Itching  . Nickel Rash      Review of Systems Constitutional: No recent fever/chills/sweats Respiratory: No recent cough/bronchitis Cardiovascular: No chest pain Gastrointestinal: No recent  nausea/vomiting/diarrhea Genitourinary: No UTI symptoms Hematologic/lymphatic:No history of coagulopathy or recent blood thinner use    Objective:   Blood pressure (!) 112/52, pulse 99, height 5\' 3"  (1.6 m), weight 204 lb (92.5 kg), last menstrual period 07/22/2020. CONSTITUTIONAL: Well-developed, well-nourished female in no acute distress.  HENT:  Normocephalic, atraumatic, External right and left ear normal. Oropharynx is clear and moist EYES: Conjunctivae and EOM are normal. Pupils are equal, round, and reactive to light. No scleral icterus.  NECK: Normal range of motion, supple, no masses SKIN: Skin is warm and dry. No rash noted. Not diaphoretic. No erythema. No pallor. NEUROLOGIC: Alert and oriented to person, place, and time. Normal reflexes, muscle tone coordination. No cranial nerve deficit noted. PSYCHIATRIC: Normal mood and affect. Normal behavior. Normal judgment and thought content. CARDIOVASCULAR: Normal heart rate noted, regular rhythm RESPIRATORY: Effort and breath sounds normal, no problems with respiration noted ABDOMEN: Soft, nontender, nondistended. PELVIC: External genitalia normal. Vagina with scant blood in vault. Cervix normal appearing, no lesions. IUD threads not visible.  MUSCULOSKELETAL: Normal range of motion. No edema and no tenderness. 2+ distal pulses.    Labs: No results found for: WBC, HGB, HCT, MCV, PLT   Imaging Studies: Patient Name: Kelly Burnett DOB: July 31, 2001 MRN: [id number] ULTRASOUND REPORT  Location: [facility]  Date of Service: 07/25/2020     Indications:AUB   Findings:  The uterus is anteverted and measures 6.5 x 2.8 x 3.9 cm.. Echo texture is homogenous without evidence of focal masses. ** The Endometrium measures 4 mm. IUD was seen in the Mid to lower potion of the endometrium.  Right Ovary measures 2.6 x 1.6 x 1.7 cm. It is normal in appearance. Left Ovary measures 2.4 x 1.6 x 1.7 cm. It is normal in  appearance. Survey of the adnexa demonstrates no adnexal masses. There is no free fluid in the cul de sac.  Impression: 1. IUD was seen as described above  Recommendations: 1.Clinical correlation with the patient's History and Physical Exam.   Jenine M. 09/24/2020    RDMS   Assessment:    Lost IUD threads Abnormal bleeding with IUD  Plan:   Counseling: Procedure, risks, reasons, benefits and complications (including injury to bowel, bladder, major blood vessel, bleeding, possibility of transfusion, infection, or fistula formation) reviewed in detail. Likelihood of success in alleviating the patient's condition was discussed. Routine postoperative instructions will be reviewed with the patient and her family in detail after surgery.  The patient concurred with the proposed plan, giving informed written consent for the surgery.  Preop testing ordered including COVID screening. Instructions reviewed, including NPO after midnight.   Marciano Sequin, MD [facility]

## 2020-07-25 NOTE — Patient Instructions (Addendum)
Hysteroscopy °Hysteroscopy is a procedure that is used to examine the inside of a woman's womb (uterus). This may be done for various reasons, including: °· To look for lumps (tumors) and other growths in the uterus. °· To evaluate abnormal bleeding, fibroid tumors, polyps, scar tissue (adhesions), or cancer of the uterus. °· To determine the cause of an inability to get pregnant (infertility) or repeated losses of pregnancies (miscarriages). °· To find a lost IUD (intrauterine device). °· To perform a procedure that permanently prevents pregnancy (sterilization). °During this procedure, a thin, flexible tube with a small light and camera (hysteroscope) is used to examine the uterus. The camera sends images to a monitor in the room so that your health care provider can view the inside of your uterus. A hysteroscopy should be done right after a menstrual period to make sure that you are not pregnant. °Tell a health care provider about: °· Any allergies you have. °· All medicines you are taking, including vitamins, herbs, eye drops, creams, and over-the-counter medicines. °· Any problems you or family members have had with the use of anesthetic medicines. °· Any blood disorders you have. °· Any surgeries you have had. °· Any medical conditions you have. °· Whether you are pregnant or may be pregnant. °What are the risks? °Generally, this is a safe procedure. However, problems may occur, including: °· Excessive bleeding. °· Infection. °· Damage to the uterus or other structures or organs. °· Allergic reaction to medicines or fluids that are used in the procedure. °What happens before the procedure? °Staying hydrated °Follow instructions from your health care provider about hydration, which may include: °· Up to 2 hours before the procedure - you may continue to drink clear liquids, such as water, clear fruit juice, black coffee, and plain tea. °Eating and drinking restrictions °Follow instructions from your health care  provider about eating and drinking, which may include: °· 8 hours before the procedure - stop eating solid foods and drink clear liquids only °· 2 hours before the procedure - stop drinking clear liquids. °General instructions °· Ask your health care provider about: °? Changing or stopping your normal medicines. This is important if you take diabetes medicines or blood thinners. °? Taking medicines such as aspirin and ibuprofen. These medicines can thin your blood and cause bleeding. Do not take these medicines for 1 week before your procedure, or as told by your health care provider. °· Do not use any products that contain nicotine or tobacco for 2 weeks before the procedure. This includes cigarettes and e-cigarettes. If you need help quitting, ask your health care provider. °· Medicine may be placed in your cervix the day before the procedure. This medicine causes the cervix to have a larger opening (dilate). The larger opening makes it easier for the hysteroscope to be inserted into the uterus during the procedure. °· Plan to have someone with you for the first 24-48 hours after the procedure, especially if you are given a medicine to make you fall asleep (general anesthetic). °· Plan to have someone take you home from the hospital or clinic. °What happens during the procedure? °· To lower your risk of infection: °? Your health care team will wash or sanitize their hands. °? Your skin will be washed with soap. °? Hair may be removed from the surgical area. °· An IV tube will be inserted into one of your veins. °· You may be given one or more of the following: °? A medicine to help   you relax (sedative). ? A medicine that numbs the area around the cervix (local anesthetic). ? A medicine to make you fall asleep (general anesthetic).  A hysteroscope will be inserted through your vagina and into your uterus.  Air or fluid will be used to enlarge your uterus, enabling your health care provider to see your uterus  better. The amount of fluid used will be carefully checked throughout the procedure.  In some cases, tissue may be gently scraped from inside the uterus and sent to a lab for testing (biopsy). The procedure may vary among health care providers and hospitals. What happens after the procedure?  Your blood pressure, heart rate, breathing rate, and blood oxygen level will be monitored until the medicines you were given have worn off.  You may have some cramping. You may be given medicines for this.  You may have bleeding, which varies from light spotting to menstrual-like bleeding. This is normal.  If you had a biopsy done, it is your responsibility to get the results of your procedure. Ask your health care provider, or the department performing the procedure, when your results will be ready. Summary  Hysteroscopy is a procedure that is used to examine the inside of a woman's womb (uterus).  After the procedure, you may have bleeding, which varies from light spotting to menstrual-like bleeding. This is normal. You may also have cramping.  Plan to have someone take you home from the hospital or clinic. This information is not intended to replace advice given to you by your health care provider. Make sure you discuss any questions you have with your health care provider. Document Revised: 11/19/2017 Document Reviewed: 01/05/2017 Elsevier Patient Education  2020 [employer].   Drospirenone tablets (contraception) What is this medicine? DROSPIRENONE (dro SPY re nown) is an oral contraceptive (birth control pill). The product contains a female hormone known as a progestin. It is used to prevent pregnancy. This medicine may be used for other purposes; ask your health care provider or pharmacist if you have questions. COMMON BRAND NAME(S): Slynd What should I tell my health care provider before I take this medicine? They need to know if you have any of these conditions:  abnormal vaginal  bleeding  adrenal gland disease  blood vessel disease or blood clots  breast, cervical, endometrial, ovarian, liver, or uterine cancer  diabetes  heart disease or recent heart attack  high potassium level  kidney disease  liver disease  mental depression  migraine headaches  stroke  an unusual or allergic reaction to drospirenone, progestins, or other medicines, foods, dyes, or preservatives  pregnant or trying to get pregnant  breast-feeding How should I use this medicine? Take this medicine by mouth. To reduce nausea, this medicine may be taken with food. Follow the directions on the prescription label. Take this medicine at the same time each day and in the order directed on the package. Do not take your medicine more often than directed. A patient package insert for the product will be given with each prescription and refill. Read this sheet carefully each time. The sheet may change frequently. Talk to your pediatrician regarding the use of this medicine in children. Special care may be needed. This medicine has been used in female children who have started having menstrual periods. Overdosage: If you think you have taken too much of this medicine contact a poison control center or emergency room at once. NOTE: This medicine is only for you. Do not share this medicine with  others. What if I miss a dose? If you miss a dose, take it as soon as you can and refer to the patient information sheet you received with your medicine for direction. If you miss more than one pill, this medicine may not be as effective and you may need to use another form of birth control. What may interact with this medicine? Do not take this medicine with any of the following medications:  atazanavir; cobicistat  bosentan  fosamprenavir This medicine may also interact with the following medications:  aprepitant  barbiturates like phenobarbital, primidone  carbamazepine  certain  antibiotics like clarithromycin, rifampin, rifabutin, rifapentine  certain antivirals for HIV or hepatitis  certain diuretics like amiloride, spironolactone, triamterene  certain medicines for fungal infections like griseofulvin, ketoconazole, itraconazole, voriconazole  certain medicines for blood pressure, heart disease  cyclosporine  felbamate  heparin  medicines for diabetes  modafinil  NSAIDs, medicines for pain and inflammation, like ibuprofen or naproxen  oxcarbazepine  phenytoin  potassium supplements  rufinamide  St. John's wort  topiramate This list may not describe all possible interactions. Give your health care provider a list of all the medicines, herbs, non-prescription drugs, or dietary supplements you use. Also tell them if you smoke, drink alcohol, or use illegal drugs. Some items may interact with your medicine. What should I watch for while using this medicine? Visit your doctor or health care professional for regular checks on your progress. You will need a regular breast and pelvic exam and Pap smear while on this medicine. You may need blood work done while you are taking this medicine. If you have any reason to think you are pregnant, stop taking this medicine right away and contact your doctor or health care professional. This medicine does not protect you against HIV infection (AIDS) or any other sexually transmitted diseases. If you are going to have elective surgery, you may need to stop taking this medicine before the surgery. Consult your health care professional for advice. What side effects may I notice from receiving this medicine? Side effects that you should report to your doctor or health care professional as soon as possible:  allergic reactions like skin rash, itching or hives, swelling of the face, lips, or tongue  breast tissue changes or discharge  depressed mood  severe pain, swelling, or tenderness in the abdomen  signs and  symptoms of a blood clot such as chest pain; shortness of breath; pain, swelling, or warmth in the leg  signs and symptoms of increased potassium like muscle weakness; chest pain; or fast, irregular heartbeat  signs and symptoms of liver injury like dark yellow or brown urine; general ill feeling or flu-like symptoms; light-colored stools; loss of appetite; nausea; right upper belly pain; unusually weak or tired; yellowing of the eyes or skin  signs and symptoms of a stroke like changes in vision; confusion; trouble speaking or understanding; severe headaches; sudden numbness or weakness of the face, arm or leg; trouble walking; dizziness; loss of balance or coordination  unusual vaginal bleeding  unusually weak or tired Side effects that usually do not require medical attention (report these to your doctor or health care professional if they continue or are bothersome):  acne  breast tenderness  headache  menstrual cramps  nausea  weight gain This list may not describe all possible side effects. Call your doctor for medical advice about side effects. You may report side effects to FDA at 1-800-FDA-1088. Where should I keep my medicine? Keep out  of the reach of children. Store at room temperature between 20 and 25 degrees C (68 and 77 degrees F). Throw away any unused medicine after the expiration date. NOTE: This sheet is a summary. It may not cover all possible information. If you have questions about this medicine, talk to your doctor, pharmacist, or health care provider.  2020 Elsevier/Gold Standard (2018-05-18 15:01:56)

## 2020-07-25 NOTE — Progress Notes (Signed)
GYN ENCOUNTER NOTE  Subjective:       Kelly Burnett is a 19 y.o. G0P0000 female is here for gynecologic evaluation of the following issues:  1. Breakthrough bleeding with IUD-provider unable to visualize string on last exam; ultrasound scheduled today  Desires IUD removal. Wishes to start pill for management of menses. Aunt reports success with "Kelly Burnett" and patient would like to try a similar pill.   Denies difficulty breathing or respiratory distress, chest pain, dysuria, and leg pain or swelling.    Gynecologic History  Patient's last menstrual period was 07/22/2020.  Contraception: abstinence and IUD  Last Pap:N/A  Obstetric History  OB History  Gravida Para Term Preterm AB Living  0 0 0 0 0 0  SAB TAB Ectopic Multiple Live Births  0 0 0 0 0    Past Medical History:  Diagnosis Date  . Anxiety associated with depression     Past Surgical History:  Procedure Laterality Date  . WISDOM TOOTH EXTRACTION      Current Outpatient Medications on File Prior to Visit  Medication Sig Dispense Refill  . desvenlafaxine (PRISTIQ) 50 MG 24 hr tablet Take 50 mg by mouth daily. As needed    . hydrOXYzine (ATARAX/VISTARIL) 10 MG tablet Take 10 mg by mouth 2 (two) times daily as needed.    . traZODone (DESYREL) 50 MG tablet Take 1 tablet by mouth as needed.    . busPIRone (BUSPAR) 15 MG tablet Take 15 mg by mouth daily.  (Patient not taking: Reported on 07/15/2020)    . FLUoxetine (PROZAC) 10 MG capsule Take 20 mg by mouth daily.  (Patient not taking: Reported on 07/15/2020)    . lamoTRIgine (LAMICTAL) 25 MG tablet Take 25 mg by mouth daily.  (Patient not taking: Reported on 07/15/2020)    . levonorgestrel (MIRENA) 20 MCG/24HR IUD 1 each by Intrauterine route once. (Patient not taking: Reported on 07/25/2020)    . omeprazole (PRILOSEC) 40 MG capsule Take 40 mg by mouth daily. (Patient not taking: Reported on 07/15/2020)    . QUEtiapine (SEROQUEL) 50 MG tablet Take 50 mg by mouth at  bedtime. (Patient not taking: Reported on 07/25/2020)     No current facility-administered medications on file prior to visit.    Allergies  Allergen Reactions  . Adhesive [Tape] Itching  . Nickel Rash    Social History   Socioeconomic History  . Marital status: Single    Spouse name: Not on file  . Number of children: Not on file  . Years of education: Not on file  . Highest education level: Not on file  Occupational History  . Not on file  Tobacco Use  . Smoking status: Never Smoker  . Smokeless tobacco: Never Used  Vaping Use  . Vaping Use: Never used  Substance and Sexual Activity  . Alcohol use: Never  . Drug use: Never  . Sexual activity: Never    Birth control/protection: I.U.D.  Other Topics Concern  . Not on file  Social History Narrative  . Not on file   Social Determinants of Health   Financial Resource Strain:   . Difficulty of Paying Living Expenses:   Food Insecurity:   . Worried About Programme researcher, broadcasting/film/video in the Last Year:   . Barista in the Last Year:   Transportation Needs:   . Freight forwarder (Medical):   Marland Kitchen Lack of Transportation (Non-Medical):   Physical Activity:   . Days of Exercise per  Week:   . Minutes of Exercise per Session:   Stress:   . Feeling of Stress :   Social Connections:   . Frequency of Communication with Friends and Family:   . Frequency of Social Gatherings with Friends and Family:   . Attends Religious Services:   . Active Member of Clubs or Organizations:   . Attends Banker Meetings:   Marland Kitchen Marital Status:   Intimate Partner Violence:   . Fear of Current or Ex-Partner:   . Emotionally Abused:   Marland Kitchen Physically Abused:   . Sexually Abused:     Family History  Problem Relation Age of Onset  . Healthy Mother   . Healthy Father   . Breast cancer Neg Hx   . Ovarian cancer Neg Hx   . Colon cancer Neg Hx     The following portions of the patient's history were reviewed and updated as  appropriate: allergies, current medications, past family history, past medical history, past social history, past surgical history and problem list.  Review of Systems  ROS negative except as noted above. Information obtained from patient and grandmother.   Objective:   BP (!) 112/52   Pulse 99   Ht 5\' 3"  (1.6 m)   Wt 204 lb (92.5 kg)   LMP 07/22/2020   BMI 36.14 kg/m    CONSTITUTIONAL: Well-developed, well-nourished female in no acute distress.   ULTRASOUND REPORT  Location: [facility]  Date of Service: 07/25/2020     Indications:AUB   Findings:  The uterus is anteverted and measures 6.5 x 2.8 x 3.9 cm.. Echo texture is homogenous without evidence of focal masses.  The Endometrium measures 4 mm. IUD was seen in the Mid to lower potion of the endometrium.  Right Ovary measures 2.6 x 1.6 x 1.7 cm. It is normal in appearance. Left Ovary measures 2.4 x 1.6 x 1.7 cm. It is normal in appearance. Survey of the adnexa demonstrates no adnexal masses. There is no free fluid in the cul de sac.  Impression: 1. IUD was seen as described above  Recommendations: 1.Clinical correlation with the patient's History and Physical Exam.   Assessment:   1. Intrauterine contraceptive device threads lost, subsequent encounter   2. Encounter for IUD removal   3. Breakthrough bleeding associated with intrauterine device (IUD)   Plan:   Ultrasound findings discussed with patient and grandmother, verbalized understanding.   Samples of Slynd provided.   Desires IUD removal, see note below.   RTC as previously scheduled or sooner if needed.    09/24/2020, CNM [facility], [facility] 07/25/20 12:16 PM   IUD Removal Note  Kelly Burnett is a 19 y.o. year old G18P0000 Caucasian female who presents for removal of a Mirena IUD. Her Mirena IUD was placed 10/27/2019.   BP (!) 112/52   Pulse 99   Ht 5\' 3"  (1.6 m)   Wt 204 lb (92.5 kg)   LMP 07/22/2020   BMI  36.14 kg/m   Time out was performed.  A small plastic speculum was placed in the vagina.  The cervix was visualized, and the strings were NOT visible. Attempts to visualize strings were made with a cytobrush, IUD hook, and Miltex Mathieu IUD forceps. All attempts were unsuccessful. The speculum was removed and CNM left room to consult Dr. .   Dr. 09/21/2020 in room to see patient. Repeat attempts made to visual IUD strings after speculum insertion with same methods. All attempts unsuccessful.  Decision to discontinue procedure due to patient discomfort and the possibility of pushing IUD strings further into cervical canal.   Options discussed with patient, mother (via phone) and grandmother.   Decision made for hysteroscopic removal of IUD in OR tomorrow.  Patient advised to go to pre-admit drive through for COVID testing.     Serafina Royals, CNM [facility], [facility] 07/25/20 12:24 PM

## 2020-07-26 ENCOUNTER — Encounter
Admission: RE | Disposition: A | Discharge status: Home / Self Care | Payer: Self-pay | Source: Home / Self Care | Attending: Obstetrics and Gynecology

## 2020-07-26 ENCOUNTER — Encounter: Payer: Self-pay | Admitting: Obstetrics and Gynecology

## 2020-07-26 ENCOUNTER — Ambulatory Visit: Payer: BC Managed Care – PPO | Admitting: Family

## 2020-07-26 ENCOUNTER — Ambulatory Visit: Payer: BC Managed Care – PPO | Admitting: Certified Nurse Midwife

## 2020-07-26 ENCOUNTER — Ambulatory Visit: Payer: BC Managed Care – PPO

## 2020-07-26 ENCOUNTER — Ambulatory Visit
Admission: RE | Admit: 2020-07-26 | Discharge: 2020-07-26 | Disposition: A | Discharge status: Home / Self Care | Payer: BC Managed Care – PPO | Attending: Obstetrics and Gynecology | Admitting: Obstetrics and Gynecology

## 2020-07-26 DIAGNOSIS — Z79899 Other long term (current) drug therapy: Secondary | ICD-10-CM | POA: Insufficient documentation

## 2020-07-26 DIAGNOSIS — F329 Major depressive disorder, single episode, unspecified: Secondary | ICD-10-CM | POA: Insufficient documentation

## 2020-07-26 DIAGNOSIS — N938 Other specified abnormal uterine and vaginal bleeding: Secondary | ICD-10-CM | POA: Diagnosis not present

## 2020-07-26 DIAGNOSIS — Z91048 Other nonmedicinal substance allergy status: Secondary | ICD-10-CM | POA: Diagnosis not present

## 2020-07-26 DIAGNOSIS — F419 Anxiety disorder, unspecified: Secondary | ICD-10-CM | POA: Diagnosis not present

## 2020-07-26 DIAGNOSIS — Z419 Encounter for procedure for purposes other than remedying health state, unspecified: Secondary | ICD-10-CM

## 2020-07-26 DIAGNOSIS — T8332XS Displacement of intrauterine contraceptive device, sequela: Secondary | ICD-10-CM

## 2020-07-26 DIAGNOSIS — N921 Excessive and frequent menstruation with irregular cycle: Secondary | ICD-10-CM | POA: Diagnosis not present

## 2020-07-26 DIAGNOSIS — T8332XA Displacement of intrauterine contraceptive device, initial encounter: Secondary | ICD-10-CM | POA: Insufficient documentation

## 2020-07-26 DIAGNOSIS — X58XXXA Exposure to other specified factors, initial encounter: Secondary | ICD-10-CM | POA: Insufficient documentation

## 2020-07-26 DIAGNOSIS — Z975 Presence of (intrauterine) contraceptive device: Secondary | ICD-10-CM | POA: Diagnosis not present

## 2020-07-26 HISTORY — PX: IUD REMOVAL: SHX5392

## 2020-07-26 HISTORY — PX: HYSTEROSCOPY: SHX211

## 2020-07-26 LAB — CBC
HCT: 38.1 % (ref 36.0–46.0)
Hemoglobin: 13.5 g/dL (ref 12.0–15.0)
MCH: 30.2 pg (ref 26.0–34.0)
MCHC: 35.4 g/dL (ref 30.0–36.0)
MCV: 85.2 fL (ref 80.0–100.0)
Platelets: 309 10*3/uL (ref 150–400)
RBC: 4.47 MIL/uL (ref 3.87–5.11)
RDW: 12.2 % (ref 11.5–15.5)
WBC: 6 10*3/uL (ref 4.0–10.5)
nRBC: 0 % (ref 0.0–0.2)

## 2020-07-26 LAB — POCT PREGNANCY, URINE: Preg Test, Ur: NEGATIVE

## 2020-07-26 SURGERY — HYSTEROSCOPY
Anesthesia: General

## 2020-07-26 MED ORDER — LIDOCAINE HCL (PF) 2 % IJ SOLN
INTRAMUSCULAR | Status: AC
  Filled 2020-07-26: qty 5

## 2020-07-26 MED ORDER — FENTANYL CITRATE (PF) 100 MCG/2ML IJ SOLN
INTRAMUSCULAR | Status: DC | PRN
  Administered 2020-07-26: 25 ug via INTRAVENOUS
  Administered 2020-07-26 (×2): 50 ug via INTRAVENOUS
  Administered 2020-07-26: 25 ug via INTRAVENOUS
  Administered 2020-07-26: 50 ug via INTRAVENOUS

## 2020-07-26 MED ORDER — SUCCINYLCHOLINE CHLORIDE 200 MG/10ML IV SOSY
PREFILLED_SYRINGE | INTRAVENOUS | Status: AC
  Filled 2020-07-26: qty 10

## 2020-07-26 MED ORDER — OXYCODONE-ACETAMINOPHEN 5-325 MG PO TABS: 1.0000 | ORAL_TABLET | Freq: Four times a day (QID) | ORAL | 0 refills | Status: DC | PRN

## 2020-07-26 MED ORDER — LACTATED RINGERS IV SOLN: INTRAVENOUS | Status: DC

## 2020-07-26 MED ORDER — CHLORHEXIDINE GLUCONATE 0.12 % MT SOLN: 15.0000 mL | Freq: Once | OROMUCOSAL | Status: AC

## 2020-07-26 MED ORDER — KETOROLAC TROMETHAMINE 15 MG/ML IJ SOLN
INTRAMUSCULAR | Status: AC
  Administered 2020-07-26: 15 mg via INTRAVENOUS
  Filled 2020-07-26: qty 1

## 2020-07-26 MED ORDER — FENTANYL CITRATE (PF) 100 MCG/2ML IJ SOLN
INTRAMUSCULAR | Status: AC
  Filled 2020-07-26: qty 2

## 2020-07-26 MED ORDER — MIDAZOLAM HCL 2 MG/2ML IJ SOLN
INTRAMUSCULAR | Status: AC
  Filled 2020-07-26: qty 2

## 2020-07-26 MED ORDER — LIDOCAINE HCL (CARDIAC) PF 100 MG/5ML IV SOSY
PREFILLED_SYRINGE | INTRAVENOUS | Status: DC | PRN
  Administered 2020-07-26: 40 mg via INTRAVENOUS

## 2020-07-26 MED ORDER — BUPIVACAINE HCL (PF) 0.5 % IJ SOLN
INTRAMUSCULAR | Status: AC
  Filled 2020-07-26: qty 30

## 2020-07-26 MED ORDER — PROPOFOL 10 MG/ML IV BOLUS
INTRAVENOUS | Status: DC | PRN
  Administered 2020-07-26: 200 mg via INTRAVENOUS

## 2020-07-26 MED ORDER — DEXMEDETOMIDINE (PRECEDEX) IN NS 20 MCG/5ML (4 MCG/ML) IV SYRINGE
PREFILLED_SYRINGE | INTRAVENOUS | Status: DC | PRN
  Administered 2020-07-26 (×2): 4 ug via INTRAVENOUS

## 2020-07-26 MED ORDER — BUPIVACAINE HCL 0.5 % IJ SOLN
INTRAMUSCULAR | Status: DC | PRN
  Administered 2020-07-26: 10 mL

## 2020-07-26 MED ORDER — MIDAZOLAM HCL 2 MG/2ML IJ SOLN
INTRAMUSCULAR | Status: DC | PRN
  Administered 2020-07-26: 2 mg via INTRAVENOUS

## 2020-07-26 MED ORDER — SUGAMMADEX SODIUM 200 MG/2ML IV SOLN
INTRAVENOUS | Status: DC | PRN
Start: 2020-07-26 — End: 2020-07-26
  Administered 2020-07-26: 100 mg via INTRAVENOUS

## 2020-07-26 MED ORDER — LIDOCAINE HCL (PF) 1 % IJ SOLN
INTRAMUSCULAR | Status: AC
  Filled 2020-07-26: qty 30

## 2020-07-26 MED ORDER — CHLORHEXIDINE GLUCONATE 0.12 % MT SOLN
OROMUCOSAL | Status: AC
  Administered 2020-07-26: 15 mL via OROMUCOSAL
  Filled 2020-07-26: qty 15

## 2020-07-26 MED ORDER — PHENYLEPHRINE HCL (PRESSORS) 10 MG/ML IV SOLN
INTRAVENOUS | Status: DC | PRN
  Administered 2020-07-26: 100 ug via INTRAVENOUS

## 2020-07-26 MED ORDER — PROPOFOL 10 MG/ML IV BOLUS
INTRAVENOUS | Status: AC
  Filled 2020-07-26: qty 40

## 2020-07-26 MED ORDER — FENTANYL CITRATE (PF) 100 MCG/2ML IJ SOLN: 25.0000 ug | INTRAMUSCULAR | Status: DC | PRN

## 2020-07-26 MED ORDER — DOCUSATE SODIUM 100 MG PO CAPS
100.0000 mg | ORAL_CAPSULE | Freq: Two times a day (BID) | ORAL | 1 refills | Status: DC | PRN
Start: 2020-07-26 — End: 2020-11-29

## 2020-07-26 MED ORDER — SEVOFLURANE IN SOLN
RESPIRATORY_TRACT | Status: AC
  Filled 2020-07-26: qty 250

## 2020-07-26 MED ORDER — ROCURONIUM BROMIDE 100 MG/10ML IV SOLN
INTRAVENOUS | Status: DC | PRN
  Administered 2020-07-26: 50 mg via INTRAVENOUS

## 2020-07-26 MED ORDER — SUCCINYLCHOLINE CHLORIDE 20 MG/ML IJ SOLN
INTRAMUSCULAR | Status: DC | PRN
Start: 2020-07-26 — End: 2020-07-26
  Administered 2020-07-26: 100 mg via INTRAVENOUS

## 2020-07-26 MED ORDER — POVIDONE-IODINE 10 % EX SWAB: 2.0000 "application " | Freq: Once | CUTANEOUS | Status: DC

## 2020-07-26 MED ORDER — ONDANSETRON HCL 4 MG/2ML IJ SOLN: 4.0000 mg | Freq: Once | INTRAMUSCULAR | Status: DC | PRN

## 2020-07-26 MED ORDER — ONDANSETRON HCL 4 MG/2ML IJ SOLN
INTRAMUSCULAR | Status: AC
  Filled 2020-07-26: qty 2

## 2020-07-26 MED ORDER — DEXAMETHASONE SODIUM PHOSPHATE 10 MG/ML IJ SOLN
INTRAMUSCULAR | Status: DC | PRN
  Administered 2020-07-26: 5 mg via INTRAVENOUS

## 2020-07-26 MED ORDER — SIMETHICONE 80 MG PO CHEW
80.0000 mg | CHEWABLE_TABLET | Freq: Four times a day (QID) | ORAL | 0 refills | Status: DC | PRN
Start: 2020-07-26 — End: 2020-11-29

## 2020-07-26 MED ORDER — ORAL CARE MOUTH RINSE: 15.0000 mL | Freq: Once | OROMUCOSAL | Status: AC

## 2020-07-26 MED ORDER — SILVER NITRATE-POT NITRATE 75-25 % EX MISC
CUTANEOUS | Status: AC
  Filled 2020-07-26: qty 10

## 2020-07-26 MED ORDER — ONDANSETRON HCL 4 MG/2ML IJ SOLN
INTRAMUSCULAR | Status: DC | PRN
  Administered 2020-07-26: 4 mg via INTRAVENOUS

## 2020-07-26 MED ORDER — ACETAMINOPHEN 500 MG PO TABS: 1000.0000 mg | ORAL_TABLET | ORAL | Status: AC

## 2020-07-26 MED ORDER — EPHEDRINE SULFATE 50 MG/ML IJ SOLN
INTRAMUSCULAR | Status: DC | PRN
  Administered 2020-07-26: 10 mg via INTRAVENOUS

## 2020-07-26 MED ORDER — KETOROLAC TROMETHAMINE 15 MG/ML IJ SOLN: 15.0000 mg | INTRAMUSCULAR | Status: AC

## 2020-07-26 MED ORDER — ACETAMINOPHEN 500 MG PO TABS
ORAL_TABLET | ORAL | Status: AC
  Administered 2020-07-26: 1000 mg via ORAL
  Filled 2020-07-26: qty 2

## 2020-07-26 MED ORDER — IBUPROFEN 800 MG PO TABS: 800.0000 mg | ORAL_TABLET | Freq: Three times a day (TID) | ORAL | 1 refills | Status: AC | PRN

## 2020-07-26 MED ORDER — ROCURONIUM BROMIDE 10 MG/ML (PF) SYRINGE
PREFILLED_SYRINGE | INTRAVENOUS | Status: AC
  Filled 2020-07-26: qty 10

## 2020-07-26 SURGICAL SUPPLY — 39 items
APL PRP STRL LF DISP 70% ISPRP (MISCELLANEOUS) ×2
BAG INFUSER PRESSURE 100CC (MISCELLANEOUS) ×3 IMPLANT
CATH ROBINSON RED A/P 16FR (CATHETERS) ×3 IMPLANT
CHLORAPREP W/TINT 26 (MISCELLANEOUS) ×6 IMPLANT
COVER LIGHT HANDLE STERIS (MISCELLANEOUS) ×6 IMPLANT
COVER WAND RF STERILE (DRAPES) ×3 IMPLANT
CUP MEDICINE 2OZ PLAST GRAD ST (MISCELLANEOUS) ×3 IMPLANT
DRAPE C-ARM XRAY 36X54 (DRAPES) ×3 IMPLANT
DRAPE LAPAROTOMY 100X77 ABD (DRAPES) ×3 IMPLANT
DRAPE UNDER BUTTOCK W/FLU (DRAPES) ×3 IMPLANT
DRSG TELFA 3X8 NADH (GAUZE/BANDAGES/DRESSINGS) ×3 IMPLANT
GAUZE 4X4 16PLY RFD (DISPOSABLE) ×6 IMPLANT
GLOVE BIO SURGEON STRL SZ 6.5 (GLOVE) ×2 IMPLANT
GLOVE BIO SURGEONS STRL SZ 6.5 (GLOVE) ×1
GLOVE INDICATOR 7.0 STRL GRN (GLOVE) ×3 IMPLANT
GOWN STRL REUS W/ TWL LRG LVL3 (GOWN DISPOSABLE) ×5 IMPLANT
GOWN STRL REUS W/TWL LRG LVL3 (GOWN DISPOSABLE) ×15
IV LACTATED RINGER IRRG 3000ML (IV SOLUTION) ×3
IV LR IRRIG 3000ML ARTHROMATIC (IV SOLUTION) ×1 IMPLANT
KIT PROCEDURE FLUENT (KITS) ×3 IMPLANT
KIT TURNOVER CYSTO (KITS) ×3 IMPLANT
L-HOOK LAP DISP 36CM (ELECTROSURGICAL) ×3
LHOOK LAP DISP 36CM (ELECTROSURGICAL) ×1 IMPLANT
NDL HPO THNWL 1X22GA REG BVL (NEEDLE) ×1 IMPLANT
NEEDLE SAFETY 22GX1 (NEEDLE) ×3
PACK DNC HYST (MISCELLANEOUS) ×3 IMPLANT
PACK GYN LAPAROSCOPIC (MISCELLANEOUS) ×3 IMPLANT
PAD OB MATERNITY 4.3X12.25 (PERSONAL CARE ITEMS) ×3 IMPLANT
PAD PREP 24X41 OB/GYN DISP (PERSONAL CARE ITEMS) ×3 IMPLANT
SLEEVE ENDOPATH XCEL 5M (ENDOMECHANICALS) ×6 IMPLANT
SOL PREP PVP 2OZ (MISCELLANEOUS) ×3
SOLUTION PREP PVP 2OZ (MISCELLANEOUS) ×1 IMPLANT
SURGILUBE 2OZ TUBE FLIPTOP (MISCELLANEOUS) ×3 IMPLANT
SUT MNCRL 4-0 (SUTURE) ×3
SUT MNCRL 4-0 27XMFL (SUTURE) ×1
SUT VIC AB 0 CT1 36 (SUTURE) ×3 IMPLANT
SUTURE MNCRL 4-0 27XMF (SUTURE) ×1 IMPLANT
TOWEL OR 17X26 4PK STRL BLUE (TOWEL DISPOSABLE) ×3 IMPLANT
TROCAR XCEL NON-BLD 5MMX100MML (ENDOMECHANICALS) ×3 IMPLANT

## 2020-07-26 NOTE — Anesthesia Procedure Notes (Signed)
Procedure Name: LMA Insertion Date/Time: 07/26/2020 7:45 AM Performed by: Manning Charity, CRNA Pre-anesthesia Checklist: Patient identified, Emergency Drugs available, Suction available and Patient being monitored Patient Re-evaluated:Patient Re-evaluated prior to induction Oxygen Delivery Method: Circle system utilized Preoxygenation: Pre-oxygenation with 100% oxygen Induction Type: IV induction Ventilation: Mask ventilation without difficulty LMA: LMA inserted LMA Size: 3.5 Number of attempts: 1 Placement Confirmation: positive ETCO2 and breath sounds checked- equal and bilateral Tube secured with: Tape

## 2020-07-26 NOTE — Anesthesia Procedure Notes (Signed)
Procedure Name: Intubation Date/Time: 07/26/2020 9:02 AM Performed by: Manning Charity, CRNA Pre-anesthesia Checklist: Patient identified, Emergency Drugs available, Suction available and Patient being monitored Patient Re-evaluated:Patient Re-evaluated prior to induction Oxygen Delivery Method: Circle system utilized Preoxygenation: Pre-oxygenation with 100% oxygen Induction Type: IV induction and Rapid sequence Ventilation: Mask ventilation without difficulty Laryngoscope Size: McGraph and 3 Grade View: Grade I Tube type: Oral Tube size: 7.0 mm Number of attempts: 1 Airway Equipment and Method: Stylet Placement Confirmation: ETT inserted through vocal cords under direct vision,  positive ETCO2 and breath sounds checked- equal and bilateral Secured at: 20 cm Tube secured with: Tape Dental Injury: Teeth and Oropharynx as per pre-operative assessment

## 2020-07-26 NOTE — Anesthesia Preprocedure Evaluation (Signed)
Anesthesia Evaluation  Patient identified by MRN, date of birth, ID band Patient awake    Reviewed: Allergy & Precautions, H&P , NPO status , Patient's Chart, lab work & pertinent test results, reviewed documented beta blocker date and time   Airway Mallampati: II  TM Distance: >3 FB Neck ROM: full    Dental  (+) Teeth Intact   Pulmonary neg pulmonary ROS,    Pulmonary exam normal        Cardiovascular Exercise Tolerance: Good negative cardio ROS Normal cardiovascular exam Rate:Normal     Neuro/Psych PSYCHIATRIC DISORDERS Anxiety Depression negative neurological ROS     GI/Hepatic negative GI ROS, Neg liver ROS,   Endo/Other  negative endocrine ROS  Renal/GU negative Renal ROS  negative genitourinary   Musculoskeletal   Abdominal   Peds  Hematology negative hematology ROS (+)   Anesthesia Other Findings   Reproductive/Obstetrics negative OB ROS                             Anesthesia Physical Anesthesia Plan  ASA: II  Anesthesia Plan: General LMA   Post-op Pain Management:    Induction:   PONV Risk Score and Plan:   Airway Management Planned:   Additional Equipment:   Intra-op Plan:   Post-operative Plan:   Informed Consent: I have reviewed the patients History and Physical, chart, labs and discussed the procedure including the risks, benefits and alternatives for the proposed anesthesia with the patient or authorized representative who has indicated his/her understanding and acceptance.       Plan Discussed with: CRNA  Anesthesia Plan Comments:         Anesthesia Quick Evaluation

## 2020-07-26 NOTE — Op Note (Signed)
Procedure(s): HYSTEROSCOPY INTRAUTERINE DEVICE (IUD) REMOVAL, Laproscopic IUD retrieval Procedure Note  Kelly Burnett female 19 y.o. 07/26/2020  Indications: The patient is a 19 y.o. G0P0000 female with lost IUD threads, abnormal breakthrough bleeding with IUD. Ultrasound performed yesterday notes IUD in uterine cavitiy.  Pre-operative Diagnosis: lost IUD threads, abnormal breakthrough bleeding with IUD.   Post-operative Diagnosis: Same, with intra-abdominal IUD location  Surgeon: Hildred Laser, MD  Assistants:  Surgical scrub tech.    Anesthesia: General endotracheal anesthesia   Procedure Details: The patient was seen in the Holding Room. The risks, benefits, complications, treatment options, and expected outcomes were discussed with the patient.  The patient concurred with the proposed plan, giving informed consent.  The site of surgery properly noted/marked. The patient was taken to the Operating Room, identified as Kelly Burnett and the procedure verified as Procedure(s) (LRB): HYSTEROSCOPY (N/A) INTRAUTERINE DEVICE (IUD) REMOVAL.  A Time Out was held and the above information confirmed.  The patient was then placed under general anesthesia without difficulty.  She was then prepped and draped in the normal sterile fashion, and placed in the dorsal lithotomy position.  A time out was performed.  An exam under anesthesia was performed with the findings noted above.  Straight catheterization was performed. A sterile speculum was inserted into vagina. A single-tooth tenaculum was used to grasp the anterior lip of the cervix. Cervical dilation was performed. A 5 mm hysteroscope was introduced into the uterus under direct visualization. The cavity was allowed to fill, and then the entire cavity was explored with the findings described above. No evidence of the IUD within the uterine cavity.  No obvious defect in the uterine cavity as potential site of perforation. The decision was made to  obtain an X-ray of the abdomen and pelvis to identify if possible IUD migration occurred.  Abdominal X-ray identified IUD location in the right lower pelvis.  The decision was made to convert the surgery to laparoscopy for abdominal removal of the IUD.  The patient's family was made aware of patient's status and need for change in procedure.  verbal consent was given.   She was then re-prepped and re-drapped maintaining a sterile manner. A sterile speculum was inserted into the vagina and the cervix was grasped at the anterior lip using a single-toothed tenaculum.  The uterus was sounded to 7 cm, and a Hulka clamp was placed for uterine manipulation.  The speculum and tenaculum were then removed. After an adequate timeout was performed, attention was turned to the abdomen where an umbilical incision was made with the scalpel.  The Optiview 5-mm trocar and sleeve were then advanced without difficulty with the laparoscope under direct visualization into the abdomen.  The abdomen was then insufflated with carbon dioxide gas and adequate pneumoperitoneum was obtained. Bilateral 5-mm lower quadrant ports were then placed under direct visualization.  A survey of the patient's pelvis and abdomen revealed the findings as above. The IUD was identified, partially entangled in the peritoneum. The IUD was removed from the abdomen successfully through the 5-mm port site.   The  A final survey was performed, where good hemostasis was noted throughout.  All trocars were removed under direct visualization, and the abdomen which was desufflated.    All skin incisions were closed with 4-0 Monocryl subcuticular stitches. A total of 10 cc of 0.5% Sensorcaine was injected into the incision sites. The incisions were covered with Dermabond. The patient tolerated the procedures well.  All instruments, needles, and sponge  counts were correct x 2. The patient was taken to the recovery room awake, extubated and in stable condition.    Findings: The uterus was sounded to 7 cm. Hysteroscopy with normal intrauterine cavity, no IUD present. Bilateral tubal ostia visualized.  Laparoscopy with normal appearing uterus, fallopian tubes and ovaries. Normal upper abdomen. IUD located within peritoneum in lower right abdomen. .   Estimated Blood Loss: minimal      Drains: straight catheterization prior to procedure with  50 ml of clear urine         Total IV Fluids: 1300 ml  Specimens: None         Implants: None         Complications:  None; patient tolerated the procedure well.         Disposition: PACU - hemodynamically stable.         Condition: stable   Hildred Laser, MD [facility]

## 2020-07-26 NOTE — Discharge Instructions (Signed)

## 2020-07-26 NOTE — Transfer of Care (Signed)
Immediate Anesthesia Transfer of Care Note  Patient: Kelly Burnett  Procedure(s) Performed: HYSTEROSCOPY (N/A ) INTRAUTERINE DEVICE (IUD) REMOVAL, Laproscopic IUD retrieval (N/A )  Patient Location: PACU  Anesthesia Type:General  Level of Consciousness: drowsy  Airway & Oxygen Therapy: Patient Spontanous Breathing and Patient connected to face mask oxygen  Post-op Assessment: Report given to RN and Post -op Vital signs reviewed and stable  Post vital signs: Reviewed and stable  Last Vitals:  Vitals Value Taken Time  BP    Temp    Pulse 64 07/26/20 1019  Resp 18 07/26/20 1019  SpO2 100 % 07/26/20 1019  Vitals shown include unvalidated device data.  Last Pain:  Vitals:   07/26/20 0618  TempSrc: Tympanic  PainSc: 0-No pain         Complications: No complications documented.

## 2020-07-26 NOTE — OR Nursing (Signed)
Explanted IUD

## 2020-07-26 NOTE — Interval H&P Note (Signed)
History and Physical Interval Note:  07/26/2020 7:28 AM  Kelly Burnett  has presented today for surgery, with tlost IUD threads, abnormal bleeding associated with IUD.  The various methods of treatment have been discussed with the patient and family. After consideration of risks, benefits and other options for treatment, the patient has consented to  Procedure(s): HYSTEROSCOPY (N/A) INTRAUTERINE DEVICE (IUD) REMOVAL (N/A) as a surgical intervention.  The patient's history has been reviewed, patient examined, no change in status, stable for surgery.  I have reviewed the patient's chart and labs.  Questions were answered to the patient's satisfaction.     Hildred Laser, MD [facility]

## 2020-07-28 ENCOUNTER — Encounter: Payer: Self-pay | Admitting: Emergency Medicine

## 2020-07-28 ENCOUNTER — Emergency Department
Admission: EM | Admit: 2020-07-28 | Discharge: 2020-07-28 | Disposition: A | Discharge status: Home / Self Care | Payer: BC Managed Care – PPO | Attending: Emergency Medicine | Admitting: Emergency Medicine

## 2020-07-28 ENCOUNTER — Other Ambulatory Visit: Payer: Self-pay

## 2020-07-28 DIAGNOSIS — R042 Hemoptysis: Secondary | ICD-10-CM | POA: Insufficient documentation

## 2020-07-28 DIAGNOSIS — Z5321 Procedure and treatment not carried out due to patient leaving prior to being seen by health care provider: Secondary | ICD-10-CM | POA: Insufficient documentation

## 2020-07-28 LAB — CBC WITH DIFFERENTIAL/PLATELET
Abs Immature Granulocytes: 0.03 10*3/uL (ref 0.00–0.07)
Basophils Absolute: 0.1 10*3/uL (ref 0.0–0.1)
Basophils Relative: 1 %
Eosinophils Absolute: 0.2 10*3/uL (ref 0.0–0.5)
Eosinophils Relative: 2 %
HCT: 39 % (ref 36.0–46.0)
Hemoglobin: 13.5 g/dL (ref 12.0–15.0)
Immature Granulocytes: 0 %
Lymphocytes Relative: 33 %
Lymphs Abs: 3.1 10*3/uL (ref 0.7–4.0)
MCH: 29.7 pg (ref 26.0–34.0)
MCHC: 34.6 g/dL (ref 30.0–36.0)
MCV: 85.7 fL (ref 80.0–100.0)
Monocytes Absolute: 0.8 10*3/uL (ref 0.1–1.0)
Monocytes Relative: 8 %
Neutro Abs: 5.3 10*3/uL (ref 1.7–7.7)
Neutrophils Relative %: 56 %
Platelets: 337 10*3/uL (ref 150–400)
RBC: 4.55 MIL/uL (ref 3.87–5.11)
RDW: 12.5 % (ref 11.5–15.5)
WBC: 9.5 10*3/uL (ref 4.0–10.5)
nRBC: 0 % (ref 0.0–0.2)

## 2020-07-28 LAB — BASIC METABOLIC PANEL
Anion gap: 10 (ref 5–15)
BUN: 17 mg/dL (ref 6–20)
CO2: 23 mmol/L (ref 22–32)
Calcium: 9.1 mg/dL (ref 8.9–10.3)
Chloride: 107 mmol/L (ref 98–111)
Creatinine, Ser: 0.79 mg/dL (ref 0.44–1.00)
GFR calc Af Amer: >60 mL/min (ref >60)
GFR calc non Af Amer: >60 mL/min (ref >60)
Glucose, Bld: 88 mg/dL (ref 70–99)
Potassium: 3.8 mmol/L (ref 3.5–5.1)
Sodium: 140 mmol/L (ref 135–145)

## 2020-07-28 NOTE — ED Notes (Addendum)
Dr. Logan Bores from [facility] came into ED for unofficial consult. Pt left after seeing Dr. Logan Bores

## 2020-07-28 NOTE — ED Triage Notes (Signed)
Pt to ED via POV c/o coughing up blood. Pt states that she had surgery on Friday to remove her IUD that had moved into her abd and she had to be intubated. PT states that she amount of blood she has coughed up has increased and she was told to come in and be seen. Pt is in NAD.

## 2020-07-29 ENCOUNTER — Encounter: Payer: Self-pay | Admitting: Obstetrics and Gynecology

## 2020-07-31 ENCOUNTER — Ambulatory Visit (INDEPENDENT_AMBULATORY_CARE_PROVIDER_SITE_OTHER): Payer: BC Managed Care – PPO | Admitting: Obstetrics and Gynecology

## 2020-07-31 ENCOUNTER — Encounter: Payer: Self-pay | Admitting: Obstetrics and Gynecology

## 2020-07-31 ENCOUNTER — Telehealth: Payer: Self-pay | Admitting: Obstetrics and Gynecology

## 2020-07-31 VITALS — BP 95/62 | HR 90 | Ht 63.0 in | Wt 199.7 lb

## 2020-07-31 DIAGNOSIS — Z9889 Other specified postprocedural states: Secondary | ICD-10-CM

## 2020-07-31 DIAGNOSIS — Z4889 Encounter for other specified surgical aftercare: Secondary | ICD-10-CM

## 2020-07-31 NOTE — Telephone Encounter (Signed)
Called pt to move appt from 4:30 to 11:15 [facility]

## 2020-07-31 NOTE — Progress Notes (Signed)
Pt present for post op visit for IUD remoal. Pt stated that one of her incisions were still sore. No other issues.

## 2020-07-31 NOTE — Anesthesia Postprocedure Evaluation (Signed)
Anesthesia Post Note  Patient: MIMIE GOERING  Procedure(s) Performed: HYSTEROSCOPY (N/A ) INTRAUTERINE DEVICE (IUD) REMOVAL, Laproscopic IUD retrieval (N/A )  Patient location during evaluation: PACU Anesthesia Type: General Level of consciousness: awake and alert Pain management: pain level controlled Vital Signs Assessment: post-procedure vital signs reviewed and stable Respiratory status: spontaneous breathing, nonlabored ventilation, respiratory function stable and patient connected to nasal cannula oxygen Cardiovascular status: blood pressure returned to baseline and stable Postop Assessment: no apparent nausea or vomiting Anesthetic complications: no   No complications documented.   Last Vitals:  Vitals:   07/26/20 1125 07/26/20 1218  BP: 115/70 118/88  Pulse: 94 (!) 107  Resp: 18 18  Temp: 36.6 C   SpO2: 95% 96%    Last Pain:  Vitals:   07/29/20 0909  TempSrc:   PainSc: 2                  Yevette Edwards

## 2020-07-31 NOTE — Progress Notes (Signed)
    OBSTETRICS/GYNECOLOGY POST-OPERATIVE CLINIC VISIT  Subjective:     Kelly Burnett is a 19 y.o. G0P0000 female who presents to the clinic 1 weeks status post diagnostic hysteroscopy and laparoscopic IUD removal for malpositioned IUD and abnormal uterine bleeding. Eating a regular diet without difficulty. Bowel movements are normal. Pain is controlled with current analgesics. Medications being used: prescription NSAID's including Ibuprofen.  States that she started her OCPs on Sunday. Began having some bleeding after her surgery (likely menses due to removal of IUD)  The following portions of the patient's history were reviewed and updated as appropriate: allergies, current medications, past family history, past medical history, past social history, past surgical history and problem list.   Review of Systems Pertinent items noted in HPI and remainder of comprehensive ROS otherwise negative.    Objective:    BP 95/62   Pulse 90   Ht 5\' 3"  (1.6 m)   Wt 199 lb 11.2 oz (90.6 kg)   LMP 07/22/2020   BMI 35.38 kg/m  General:  alert and no distress  Abdomen: soft, bowel sounds active, non-tender  Incision:   healing well, no drainage, no erythema, no hernia, no seroma, no swelling, no dehiscence, incision well approximated  Pelvis:  deferred     Pathology:  None  Assessment:    Doing well postoperatively.   Plan:   1. Continue any current medications as needed. 2. Wound care discussed. 3. Operative findings again reviewed. Pathology report discussed. 4. Activity restrictions: no lifting more than 10-15 pounds and pelvic rest x 1 week 5. Anticipated return to work: not applicable.  Patient does desire a letter for activity restrictions as she starts school next week and has a Physical Education course.  6. Follow up: 3 months with 09/21/2020, CNM for contraception surveillance.    Serafina Royals, MD [facility]

## 2020-08-13 NOTE — Telephone Encounter (Signed)
Pt called in and stated that she needs to make an appt for her Belly button incision. I called back to ask if we can add her on for same day and FH said yes but the pt stated that the times we have doesn't work for her. The pt was told that Dr. Valentino Saxon doesn't have anything else till next week. The pt was requesting to be seen sooner or if a nurse can call her to let her know what else she can do. The pt has class today from 1:30-6 and tomorrow 9:30 - 4 and she is an hour away from here.  I told the pt I would send a message to the nurse and that someone will be in touch with her. Please advise

## 2020-09-20 ENCOUNTER — Other Ambulatory Visit: Payer: Self-pay | Admitting: Certified Nurse Midwife

## 2020-09-20 DIAGNOSIS — N921 Excessive and frequent menstruation with irregular cycle: Secondary | ICD-10-CM

## 2020-09-20 MED ORDER — NORGESTIMATE-ETH ESTRADIOL 0.25-35 MG-MCG PO TABS: 1.0000 | ORAL_TABLET | Freq: Every day | ORAL | 11 refills | Status: DC

## 2020-09-20 NOTE — Progress Notes (Signed)
Rx Sprintec, see orders.    Kelly Burnett, CNM [facility], [facility] 09/20/20 6:54 PM

## 2020-09-23 ENCOUNTER — Other Ambulatory Visit: Payer: Self-pay

## 2020-09-23 MED ORDER — NORGESTIMATE-ETH ESTRADIOL 0.25-35 MG-MCG PO TABS: 1.0000 | ORAL_TABLET | Freq: Every day | ORAL | 11 refills | Status: DC

## 2020-10-14 ENCOUNTER — Other Ambulatory Visit: Payer: Self-pay

## 2020-10-14 MED ORDER — NORGESTIMATE-ETH ESTRADIOL 0.25-35 MG-MCG PO TABS: 1.0000 | ORAL_TABLET | Freq: Every day | ORAL | 0 refills | Status: DC

## 2020-11-29 ENCOUNTER — Encounter: Payer: Self-pay | Admitting: Certified Nurse Midwife

## 2020-11-29 ENCOUNTER — Other Ambulatory Visit: Payer: Self-pay

## 2020-11-29 ENCOUNTER — Ambulatory Visit (INDEPENDENT_AMBULATORY_CARE_PROVIDER_SITE_OTHER): Payer: BC Managed Care – PPO | Admitting: Certified Nurse Midwife

## 2020-11-29 VITALS — BP 112/73 | HR 93 | Ht 63.0 in | Wt 202.1 lb

## 2020-11-29 DIAGNOSIS — Z3041 Encounter for surveillance of contraceptive pills: Secondary | ICD-10-CM | POA: Diagnosis not present

## 2020-11-29 DIAGNOSIS — N920 Excessive and frequent menstruation with regular cycle: Secondary | ICD-10-CM | POA: Diagnosis not present

## 2020-11-29 NOTE — Progress Notes (Signed)
GYN ENCOUNTER NOTE  Subjective:       Kelly Burnett is a 19 y.o. G0P0000 female here for follow after starting OCP for management of menorrhagia.   Last seen in office for post operative appointment after IUD removal on 07/31/2020; for further details, please see previous note.    Gynecologic History  Patient's last menstrual period was 11/18/2020. Period Cycle (Days): 28 Period Duration (Days): 7 Period Pattern: Regular Menstrual Flow: Light Menstrual Control: Thin pad,Maxi pad Menstrual Control Change Freq (Hours): 6-8 Dysmenorrhea: (!) Moderate Dysmenorrhea Symptoms: Cramping  Contraception: OCP (estrogen/progesterone), Ortho-Cyclen  Last Pap: N/A  Obstetric History  OB History  Gravida Para Term Preterm AB Living  0 0 0 0 0 0  SAB IAB Ectopic Multiple Live Births  0 0 0 0 0    Past Medical History:  Diagnosis Date  . Anxiety associated with depression   . Migraines     Past Surgical History:  Procedure Laterality Date  . HYSTEROSCOPY N/A 07/26/2020   Procedure: HYSTEROSCOPY;  Surgeon: Hildred Laser, MD;  Location: [facility] ORS;  Service: Gynecology;  Laterality: N/A;  . IUD REMOVAL N/A 07/26/2020   Procedure: INTRAUTERINE DEVICE (IUD) REMOVAL, Laproscopic IUD retrieval;  Surgeon: Hildred Laser, MD;  Location: [facility] ORS;  Service: Gynecology;  Laterality: N/A;  . WISDOM TOOTH EXTRACTION      Current Outpatient Medications on File Prior to Visit  Medication Sig Dispense Refill  . desvenlafaxine (PRISTIQ) 50 MG 24 hr tablet Take 50 mg by mouth daily.     . hydrOXYzine (ATARAX/VISTARIL) 10 MG tablet Take 10 mg by mouth 2 (two) times daily as needed for anxiety.     Marland Kitchen ibuprofen (ADVIL) 800 MG tablet Take 1 tablet (800 mg total) by mouth every 8 (eight) hours as needed. 60 tablet 1  . lamoTRIgine (LAMICTAL) 25 MG tablet Take 25 mg by mouth daily.    . norgestimate-ethinyl estradiol (ORTHO-CYCLEN) 0.25-35 MG-MCG tablet Take 1 tablet by mouth daily. 84 tablet 0  .  omeprazole (PRILOSEC) 20 MG capsule Take by mouth.    . rizatriptan (MAXALT-MLT) 10 MG disintegrating tablet     . traZODone (DESYREL) 50 MG tablet Take 50 mg by mouth at bedtime.     . ALPRAZolam (XANAX) 0.25 MG tablet Take 0.25 mg by mouth 2 (two) times daily as needed.    . docusate sodium (COLACE) 100 MG capsule Take 1 capsule (100 mg total) by mouth 2 (two) times daily as needed for mild constipation. 30 capsule 1  . Drospirenone (SLYND) 4 MG TABS Take 4 mg by mouth daily. Take one tablet daily    . rizatriptan (MAXALT-MLT) 10 MG disintegrating tablet Take by mouth.    . simethicone (GAS-X) 80 MG chewable tablet Chew 1 tablet (80 mg total) by mouth 4 (four) times daily as needed for flatulence. 60 tablet 0  . terconazole (TERAZOL 3) 0.8 % vaginal cream      No current facility-administered medications on file prior to visit.    Allergies  Allergen Reactions  . Adhesive [Tape] Itching    If on for an extended amount of time  . Nickel Rash    If on for an extended amount of time    Social History   Socioeconomic History  . Marital status: Single    Spouse name: Not on file  . Number of children: Not on file  . Years of education: Not on file  . Highest education level: Not on file  Occupational History  .  Not on file  Tobacco Use  . Smoking status: Never Smoker  . Smokeless tobacco: Never Used  Vaping Use  . Vaping Use: Never used  Substance and Sexual Activity  . Alcohol use: Never  . Drug use: Never  . Sexual activity: Never    Birth control/protection: Pill  Other Topics Concern  . Not on file  Social History Narrative  . Not on file   Social Determinants of Health   Financial Resource Strain: Not on file  Food Insecurity: Not on file  Transportation Needs: Not on file  Physical Activity: Not on file  Stress: Not on file  Social Connections: Not on file  Intimate Partner Violence: Not on file    Family History  Problem Relation Age of Onset  . Healthy  Mother   . Healthy Father   . Breast cancer Neg Hx   . Ovarian cancer Neg Hx   . Colon cancer Neg Hx     The following portions of the patient's history were reviewed and updated as appropriate: allergies, current medications, past family history, past medical history, past social history, past surgical history and problem list.  Review of Systems  ROS - Denies difficulty breathing, pain, swelling, nausea or vomiting, and fatigue.  Objective:   BP 112/73   Pulse 93   Ht 5\' 3"  (1.6 m)   Wt 202 lb 1.6 oz (91.7 kg)   LMP 11/18/2020   BMI 35.80 kg/m   CONSTITUTIONAL: Well-developed, well-nourished female in no acute distress.   PHYSICAL EXAM: Not indicated.    Assessment:   1. Oral contraceptive pill surveillance  2. Menorrhagia with regular cycle    Plan:    Refill OCP, see orders.   Encouraged routine health maintenance techniques.   Reviewed red flag symptoms and when to call.    RTC x 1 year for  ANNUAL EXAM or sooner if needed.    11/20/2020, [employer] 11/29/20 9:39 AM

## 2020-11-29 NOTE — Progress Notes (Signed)
Pt present for 3 month f/u on ocp. Pt states menstrual cycle has gotten better.

## 2020-11-29 NOTE — Patient Instructions (Addendum)
Ethinyl Estradiol; Norgestimate tablets What is this medicine? ETHINYL ESTRADIOL; NORGESTIMATE (ETH in il es tra DYE ole; nor JES ti mate) is an oral contraceptive. The products combine two types of female hormones, an estrogen and a progestin. They are used to prevent ovulation and pregnancy. Some products are also used to treat acne in females. This medicine may be used for other purposes; ask your health care provider or pharmacist if you have questions. COMMON BRAND NAME(S): Estarylla, Mili, MONO-LINYAH, MonoNessa, Norgestimate/Ethinyl Estradiol, Ortho Tri-Cyclen, Ortho Tri-Cyclen Lo, Ortho-Cyclen, Previfem, Sprintec, Tri-Estarylla, TRI-LINYAH, Tri-Lo-Estarylla, Tri-Lo-Marzia, Tri-Lo-Mili, Tri-Lo-Sprintec, Tri-Mili, Tri-Previfem, Tri-Sprintec, Tri-VyLibra, Trinessa, Trinessa Lo, VyLibra What should I tell my health care provider before I take this medicine? They need to know if you have or ever had any of these conditions:  abnormal vaginal bleeding  blood vessel disease or blood clots  breast, cervical, endometrial, ovarian, liver, or uterine cancer  diabetes  gallbladder disease  heart disease or recent heart attack  high blood pressure  high cholesterol  kidney disease  liver disease  migraine headaches  stroke  systemic lupus erythematosus (SLE)  tobacco smoker  an unusual or allergic reaction to estrogens, progestins, other medicines, foods, dyes, or preservatives  pregnant or trying to get pregnant  breast-feeding How should I use this medicine? Take this medicine by mouth. To reduce nausea, this medicine may be taken with food. Follow the directions on the prescription label. Take this medicine at the same time each day and in the order directed on the package. Do not take your medicine more often than directed. Contact your pediatrician regarding the use of this medicine in children. Special care may be needed. This medicine has been used in female children who  have started having menstrual periods. A patient package insert for the product will be given with each prescription and refill. Read this sheet carefully each time. The sheet may change frequently. Overdosage: If you think you have taken too much of this medicine contact a poison control center or emergency room at once. NOTE: This medicine is only for you. Do not share this medicine with others. What if I miss a dose? If you miss a dose, refer to the patient information sheet you received with your medicine for direction. If you miss more than one pill, this medicine may not be as effective and you may need to use another form of birth control. What may interact with this medicine? Do not take this medicine with the following medication:  dasabuvir; ombitasvir; paritaprevir; ritonavir  ombitasvir; paritaprevir; ritonavir This medicine may also interact with the following medications:  acetaminophen  antibiotics or medicines for infections, especially rifampin, rifabutin, rifapentine, and griseofulvin, and possibly penicillins or tetracyclines  aprepitant  ascorbic acid (vitamin C)  atorvastatin  barbiturate medicines, such as phenobarbital  bosentan  carbamazepine  caffeine  clofibrate  cyclosporine  dantrolene  doxercalciferol  felbamate  grapefruit juice  hydrocortisone  medicines for anxiety or sleeping problems, such as diazepam or temazepam  medicines for diabetes, including pioglitazone  mineral oil  modafinil  mycophenolate  nefazodone  oxcarbazepine  phenytoin  prednisolone  ritonavir or other medicines for HIV infection or AIDS  rosuvastatin  selegiline  soy isoflavones supplements  St. John's wort  tamoxifen or raloxifene  theophylline  thyroid hormones  topiramate  warfarin This list may not describe all possible interactions. Give your health care provider a list of all the medicines, herbs, non-prescription drugs, or  dietary supplements you use. Also tell them if  you smoke, drink alcohol, or use illegal drugs. Some items may interact with your medicine. What should I watch for while using this medicine? Visit your doctor or health care professional for regular checks on your progress. You will need a regular breast and pelvic exam and Pap smear while on this medicine. You should also discuss the need for regular mammograms with your health care professional, and follow his or her guidelines for these tests. This medicine can make your body retain fluid, making your fingers, hands, or ankles swell. Your blood pressure can go up. Contact your doctor or health care professional if you feel you are retaining fluid. Use an additional method of contraception during the first cycle that you take these tablets. If you have any reason to think you are pregnant, stop taking this medicine right away and contact your doctor or health care professional. If you are taking this medicine for hormone related problems, it may take several cycles of use to see improvement in your condition. Do not use this product if you smoke and are over 35 years of age. Smoking increases the risk of getting a blood clot or having a stroke while you are taking birth control pills, especially if you are more than 20 years old. If you are a smoker who is 7 years of age or younger, you are strongly advised not to smoke while taking birth control pills. This medicine can make you more sensitive to the sun. Keep out of the sun. If you cannot avoid being in the sun, wear protective clothing and use sunscreen. Do not use sun lamps or tanning beds/booths. If you wear contact lenses and notice visual changes, or if the lenses begin to feel uncomfortable, consult your eye care specialist. In some women, tenderness, swelling, or minor bleeding of the gums may occur. Notify your dentist if this happens. Brushing and flossing your teeth regularly may help limit  this. See your dentist regularly and inform your dentist of the medicines you are taking. If you are going to have elective surgery, you may need to stop taking this medicine before the surgery. Consult your health care professional for advice. This medicine does not protect you against HIV infection (AIDS) or any other sexually transmitted diseases. What side effects may I notice from receiving this medicine? Side effects that you should report to your doctor or health care professional as soon as possible:  breast tissue changes or discharge  changes in vaginal bleeding during your period or between your periods  chest pain  coughing up blood  dizziness or fainting spells  headaches or migraines  leg, arm or groin pain  severe or sudden headaches  stomach pain (severe)  sudden shortness of breath  sudden loss of coordination, especially on one side of the body  speech problems  symptoms of vaginal infection like itching, irritation or unusual discharge  tenderness in the upper abdomen  vomiting  weakness or numbness in the arms or legs, especially on one side of the body  yellowing of the eyes or skin Side effects that usually do not require medical attention (report to your doctor or health care professional if they continue or are bothersome):  breakthrough bleeding and spotting that continues beyond the 3 initial cycles of pills  breast tenderness  mood changes, anxiety, depression, frustration, anger, or emotional outbursts  increased sensitivity to sun or ultraviolet light  nausea  skin rash, acne, or brown spots on the skin  weight gain (slight) This  list may not describe all possible side effects. Call your doctor for medical advice about side effects. You may report side effects to FDA at 1-800-FDA-1088. Where should I keep my medicine? Keep out of the reach of children. Store at room temperature between 15 and 30 degrees C (59 and 86 degrees F).  Throw away any unused medicine after the expiration date. NOTE: This sheet is a summary. It may not cover all possible information. If you have questions about this medicine, talk to your doctor, pharmacist, or health care provider.  2020 Elsevier/Gold Standard (2016-08-17 08:09:09)   Preventive Care 49-48 Years Old, Female Preventive care refers to lifestyle choices and visits with your health care provider that can promote health and wellness. At this stage in your life, you may start seeing a primary care physician instead of a pediatrician. Your health care is now your responsibility. Preventive care for young adults includes: A yearly physical exam. This is also called an annual wellness visit. Regular dental and eye exams. Immunizations. Screening for certain conditions. Healthy lifestyle choices, such as diet and exercise. What can I expect for my preventive care visit? Physical exam Your health care provider may check: Height and weight. These may be used to calculate body mass index (BMI), which is a measurement that tells if you are at a healthy weight. Heart rate and blood pressure. Body temperature. Counseling Your health care provider may ask you questions about: Past medical problems and family medical history. Alcohol, tobacco, and drug use. Home and relationship well-being. Access to firearms. Emotional well-being. Diet, exercise, and sleep habits. Sexual activity and sexual health. Method of birth control. Menstrual cycle. Pregnancy history. What immunizations do I need?  Influenza (flu) vaccine This is recommended every year. Tetanus, diphtheria, and pertussis (Tdap) vaccine You may need a Td booster every 10 years. Varicella (chickenpox) vaccine You may need this vaccine if you have not already been vaccinated. Human papillomavirus (HPV) vaccine If recommended by your health care provider, you may need three doses over 6 months. Measles, mumps, and rubella  (MMR) vaccine You may need at least one dose of MMR. You may also need a second dose. Meningococcal conjugate (MenACWY) vaccine One dose is recommended if you are 57-56 years old and a Market researcher living in a residence hall, or if you have one of several medical conditions. You may also need additional booster doses. Pneumococcal conjugate (PCV13) vaccine You may need this if you have certain conditions and were not previously vaccinated. Pneumococcal polysaccharide (PPSV23) vaccine You may need one or two doses if you smoke cigarettes or if you have certain conditions. Hepatitis A vaccine You may need this if you have certain conditions or if you travel or work in places where you may be exposed to hepatitis A. Hepatitis B vaccine You may need this if you have certain conditions or if you travel or work in places where you may be exposed to hepatitis B. Haemophilus influenzae type b (Hib) vaccine You may need this if you have certain risk factors. You may receive vaccines as individual doses or as more than one vaccine together in one shot (combination vaccines). Talk with your health care provider about the risks and benefits of combination vaccines. What tests do I need? Blood tests Lipid and cholesterol levels. These may be checked every 5 years starting at age 29. Hepatitis C test. Hepatitis B test. Screening Pelvic exam and Pap test. This may be done every 3 years starting at age  21. Sexually transmitted disease (STD) testing, if you are at risk. BRCA-related cancer screening. This may be done if you have a family history of breast, ovarian, tubal, or peritoneal cancers. Other tests Tuberculosis skin test. Vision and hearing tests. Skin exam. Breast exam. Follow these instructions at home: Eating and drinking  Eat a diet that includes fresh fruits and vegetables, whole grains, lean protein, and low-fat dairy products. Drink enough fluid to keep your urine pale  yellow. Do not drink alcohol if: Your health care provider tells you not to drink. You are pregnant, may be pregnant, or are planning to become pregnant. You are under the legal drinking age. In the [place] the legal drinking age is 24. If you drink alcohol: Limit how much you have to 0-1 drink a day. Be aware of how much alcohol is in your drink. In the [place] one drink equals one 12 oz bottle of beer (355 mL), one 5 oz glass of wine (148 mL), or one 1 oz glass of hard liquor (44 mL). Lifestyle Take daily care of your teeth and gums. Stay active. Exercise at least 30 minutes 5 or more days of the week. Do not use any products that contain nicotine or tobacco, such as cigarettes, e-cigarettes, and chewing tobacco. If you need help quitting, ask your health care provider. Do not use drugs. If you are sexually active, practice safe sex. Use a condom or other form of birth control (contraception) in order to prevent pregnancy and STIs (sexually transmitted infections). If you plan to become pregnant, see your health care provider for a pre-conception visit. Find healthy ways to cope with stress, such as: Meditation, yoga, or listening to music. Journaling. Talking to a trusted person. Spending time with friends and family. Safety Always wear your seat belt while driving or riding in a vehicle. Do not drive if you have been drinking alcohol. Do not ride with someone who has been drinking. Do not drive when you are tired or distracted. Do not text while driving. Wear a helmet and other protective equipment during sports activities. If you have firearms in your house, make sure you follow all gun safety procedures. Seek help if you have been bullied, physically abused, or sexually abused. Use the Internet responsibly to avoid dangers such as online bullying and online sex predators. What's next? Go to your health care provider once a year for a well check visit. Ask your health care provider how  often you should have your eyes and teeth checked. Stay up to date on all vaccines. This information is not intended to replace advice given to you by your health care provider. Make sure you discuss any questions you have with your health care provider. Document Revised: 12/01/2018 Document Reviewed: 12/01/2018 Elsevier Patient Education  2020 [employer].

## 2020-11-29 NOTE — Progress Notes (Signed)
I have seen, interviewed, and examined the patient in conjunction with the [facility] midwife and affirm the diagnosis and management plan.   Gunnar Bulla, CNM [facility], [facility] 11/29/20 9:47 AM

## 2020-12-10 ENCOUNTER — Encounter: Payer: BC Managed Care – PPO | Admitting: Obstetrics and Gynecology

## 2020-12-12 ENCOUNTER — Encounter: Payer: BC Managed Care – PPO | Admitting: Certified Nurse Midwife

## 2021-02-04 ENCOUNTER — Other Ambulatory Visit: Payer: Self-pay | Admitting: Certified Nurse Midwife

## 2021-12-01 ENCOUNTER — Encounter: Payer: BC Managed Care – PPO | Admitting: Certified Nurse Midwife

## 2021-12-18 IMAGING — XA DG C-ARM 1-60 MIN
7 series · 7 of 7 positions shown · non-contrast
Comparison: None.

CLINICAL DATA: Missing intrauterine device during hysteroscopy

EXAM:
DG PELVIS WITH C-ARM 1-60 MIN
FLUOROSCOPY TIME:  Fluoroscopy Time:  0 minutes 12 seconds
Number of Acquired Spot Images: 7

[Series 1: ortho standard · 1 of 1 slices shown (1 of 7)]
[im 1/1]
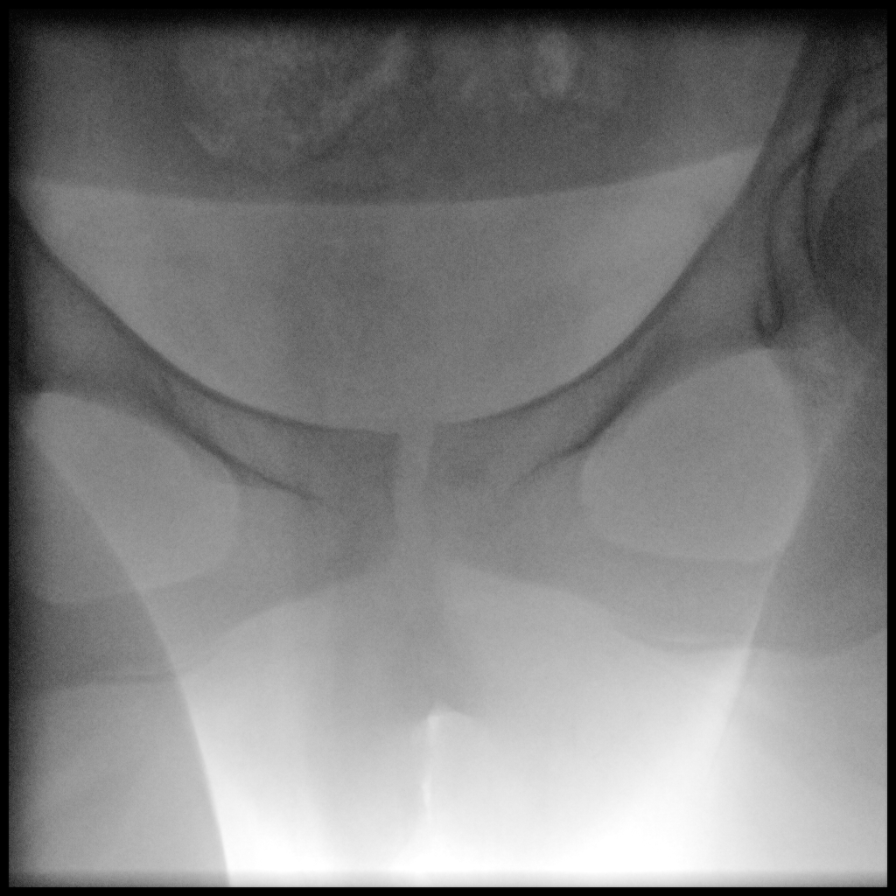

[Series 2: ortho standard · 1 of 1 slices shown (2 of 7)]
[im 1/1]
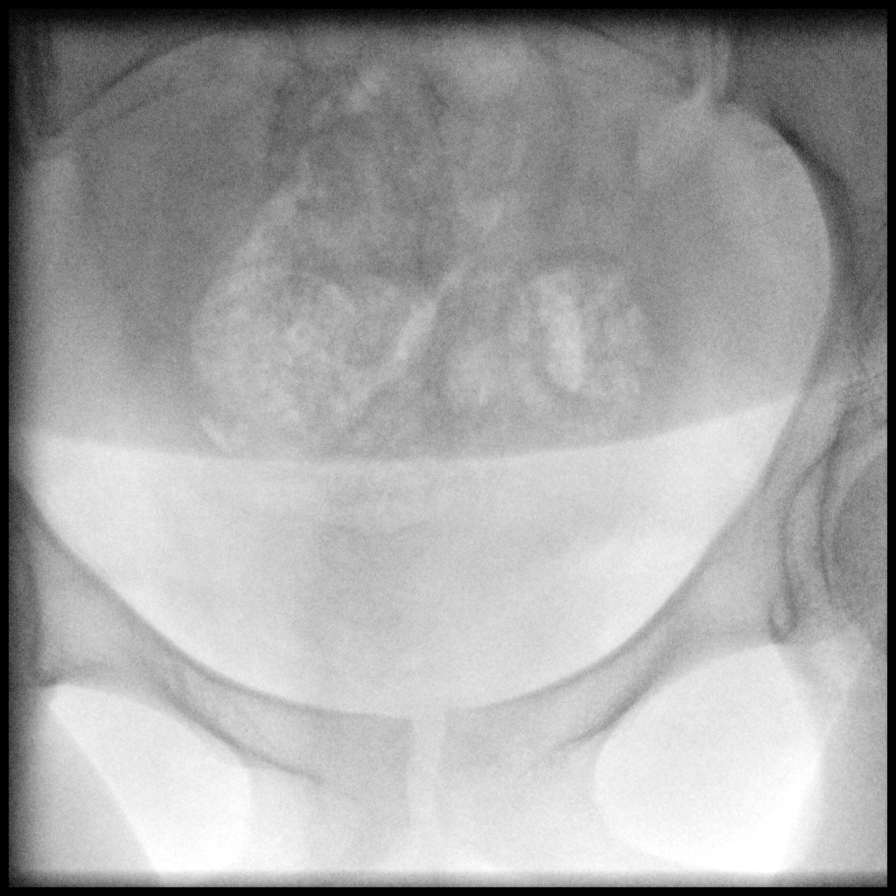

[Series 3: ortho standard · 1 of 1 slices shown (3 of 7)]
[im 1/1]
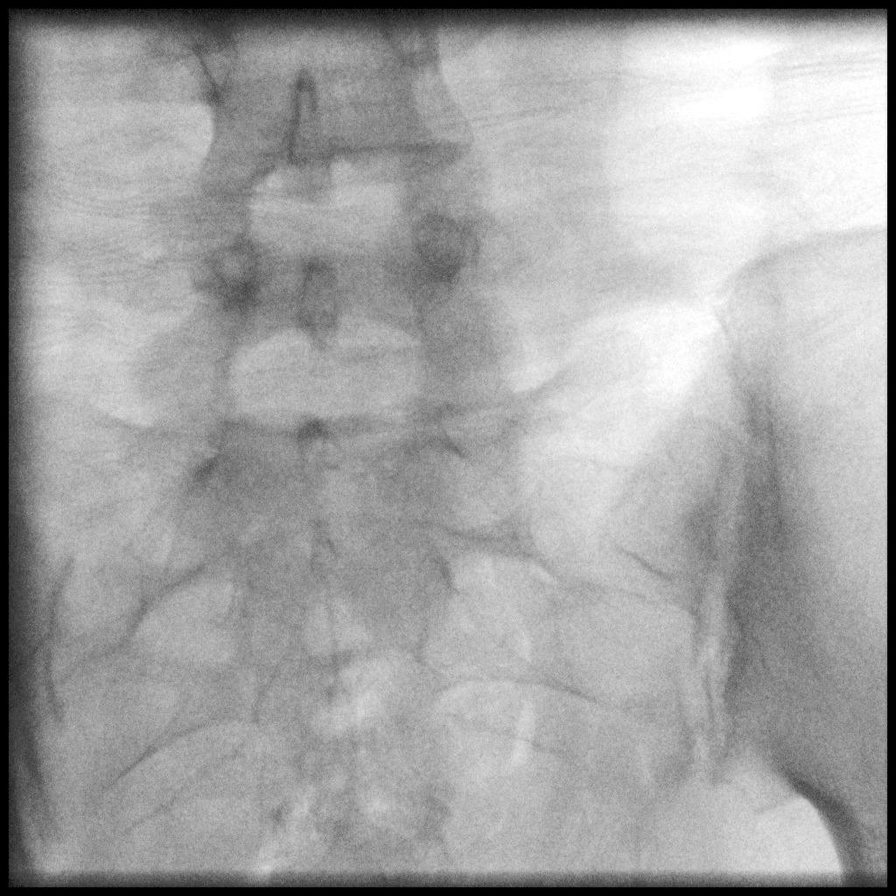

[Series 4: ortho standard · 1 of 1 slices shown (4 of 7)]
[im 1/1]
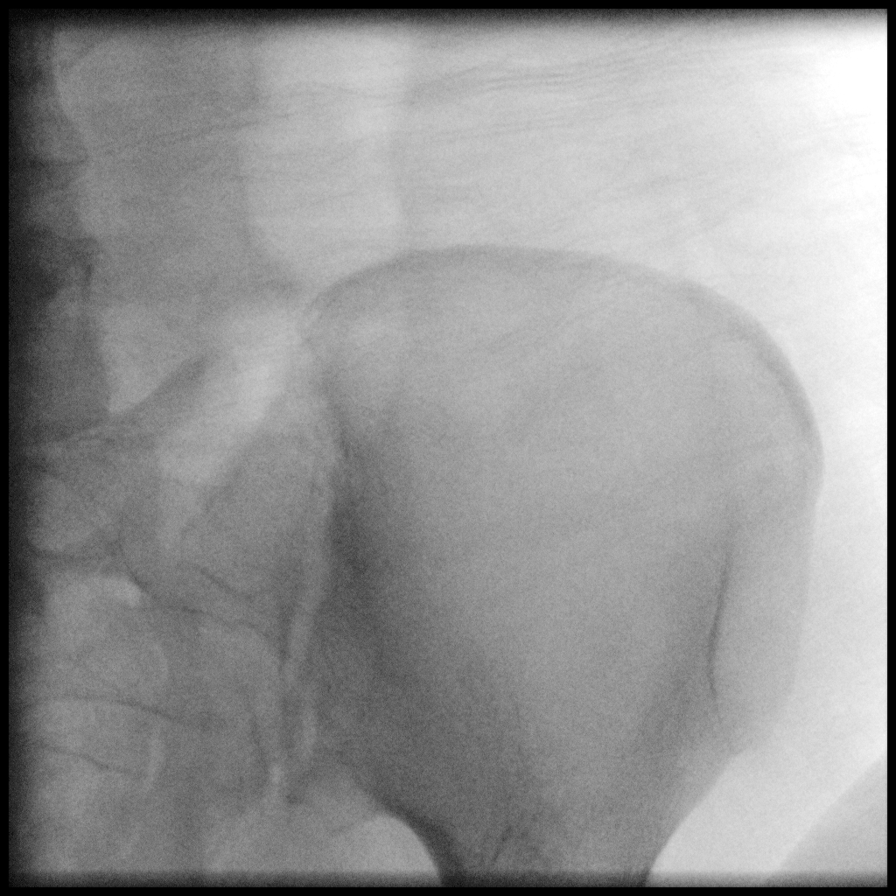

[Series 5: ortho standard · 1 of 1 slices shown (5 of 7)]
[im 1/1]
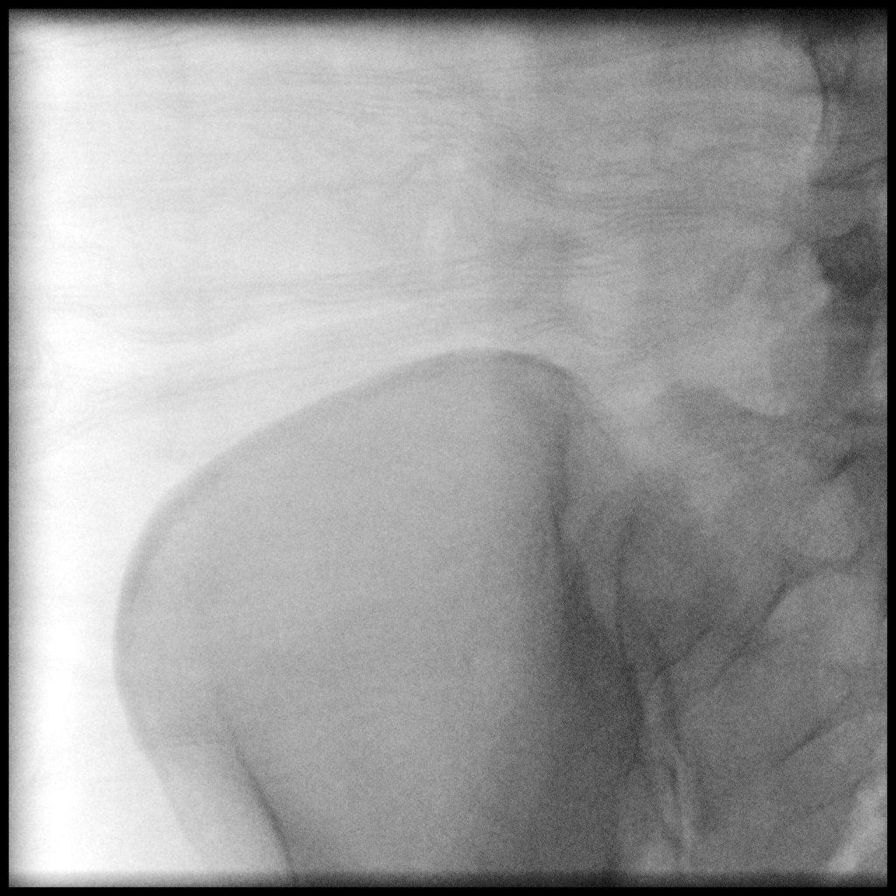

[Series 6: ortho standard · 1 of 1 slices shown (6 of 7)]
[im 1/1]
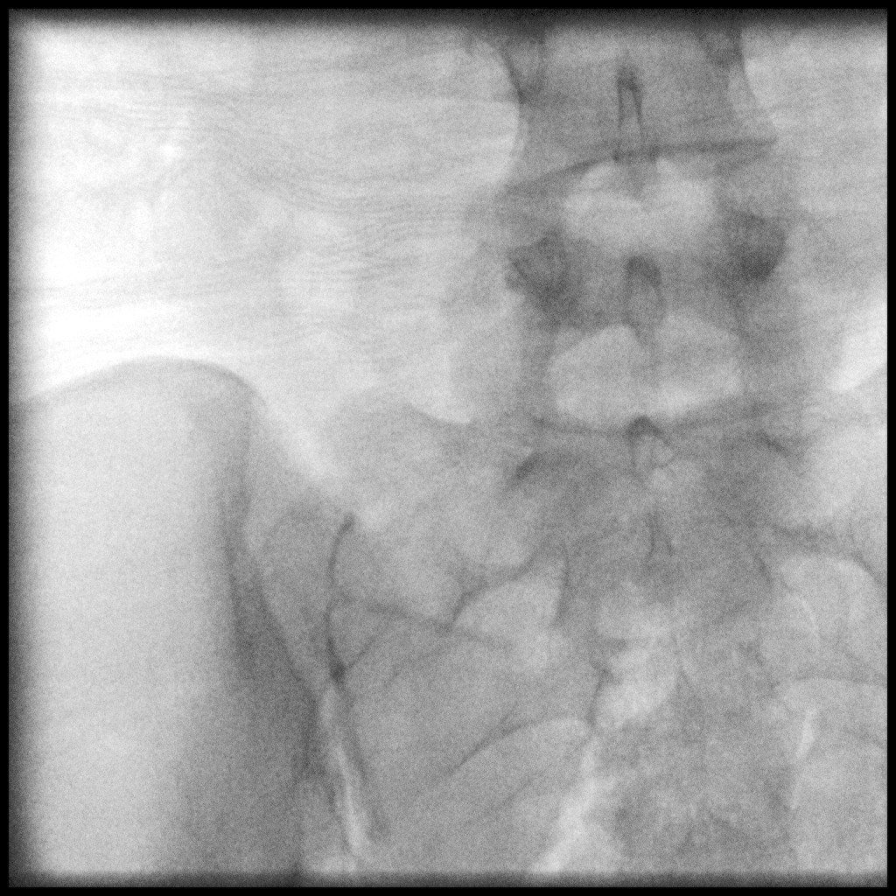

[Series 7: ortho standard · 1 of 1 slices shown (7 of 7)]
[im 1/1]
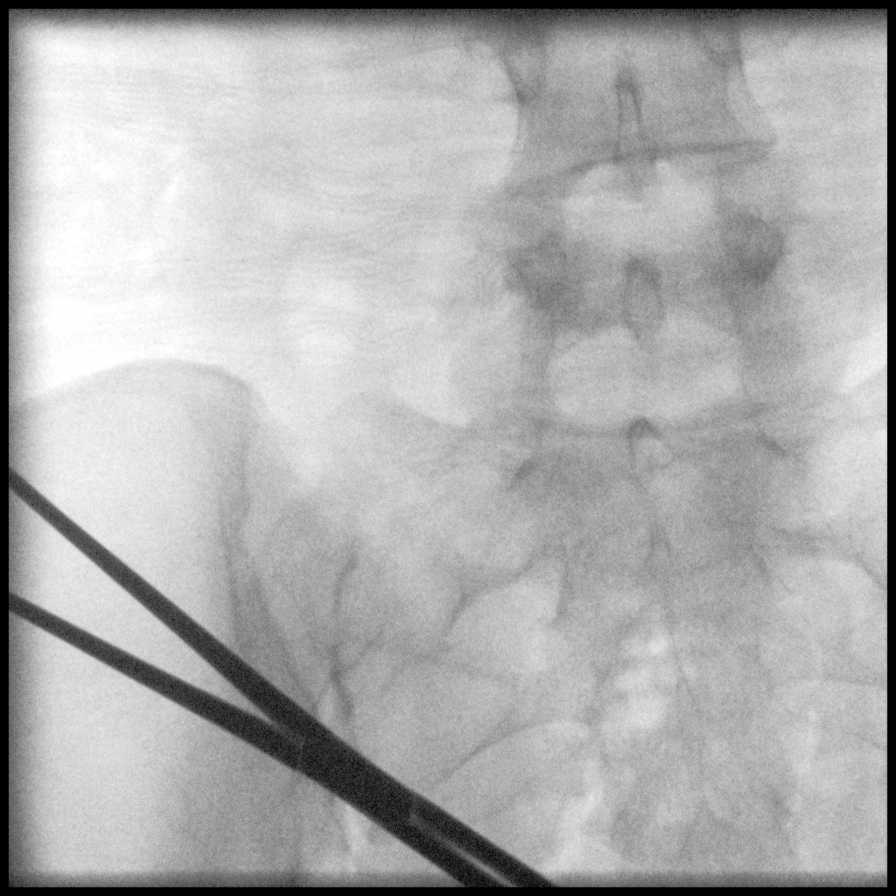

[7 of 7 positions shown; findings below may reference images not displayed]

FINDINGS: Frontal pelvic images obtained. No demonstrable intrauterine device
in the lower abdomen or pelvis. Bowel gas pattern unremarkable. No
bony lesions evident.
IMPRESSION: No demonstrable intrauterine device. Bowel gas pattern normal. No
bone lesions.
